# Patient Record
Sex: Male | Born: 1985 | Hispanic: Yes | Marital: Married | Smoking: Never smoker
Health system: Southern US, Community
[De-identification: ages and names within clinical notes are randomized; demographics above are authoritative.]

## PROBLEM LIST (undated history)

## (undated) DIAGNOSIS — E079 Disorder of thyroid, unspecified: Secondary | ICD-10-CM

## (undated) DIAGNOSIS — I471 Supraventricular tachycardia, unspecified: Secondary | ICD-10-CM

## (undated) DIAGNOSIS — I4891 Unspecified atrial fibrillation: Secondary | ICD-10-CM

## (undated) HISTORY — PX: OTHER SURGICAL HISTORY: SHX169

## (undated) HISTORY — PX: THYROIDECTOMY: SHX17

---

## 2013-01-22 ENCOUNTER — Other Ambulatory Visit: Payer: Self-pay

## 2013-01-22 ENCOUNTER — Emergency Department (HOSPITAL_COMMUNITY)
Admission: EM | Admit: 2013-01-22 | Discharge: 2013-01-23 | Disposition: A | Discharge status: Home / Self Care | Payer: Self-pay | Attending: Emergency Medicine | Admitting: Emergency Medicine

## 2013-01-22 ENCOUNTER — Emergency Department (HOSPITAL_COMMUNITY): Payer: Self-pay

## 2013-01-22 DIAGNOSIS — R0602 Shortness of breath: Secondary | ICD-10-CM | POA: Insufficient documentation

## 2013-01-22 DIAGNOSIS — R079 Chest pain, unspecified: Secondary | ICD-10-CM | POA: Insufficient documentation

## 2013-01-22 DIAGNOSIS — I4891 Unspecified atrial fibrillation: Secondary | ICD-10-CM | POA: Insufficient documentation

## 2013-01-22 LAB — CBC WITH DIFFERENTIAL/PLATELET
Basophils Absolute: 0 K/uL (ref 0.0–0.1)
Basophils Relative: 0 % (ref 0–1)
Eosinophils Absolute: 0.2 K/uL (ref 0.0–0.7)
Eosinophils Relative: 2 % (ref 0–5)
HCT: 43.6 % (ref 39.0–52.0)
Hemoglobin: 15.8 g/dL (ref 13.0–17.0)
Lymphocytes Relative: 41 % (ref 12–46)
Lymphs Abs: 3.9 K/uL (ref 0.7–4.0)
MCH: 30.3 pg (ref 26.0–34.0)
MCHC: 36.2 g/dL — ABNORMAL HIGH (ref 30.0–36.0)
MCV: 83.5 fL (ref 78.0–100.0)
Monocytes Absolute: 0.5 K/uL (ref 0.1–1.0)
Monocytes Relative: 5 % (ref 3–12)
Neutro Abs: 4.9 K/uL (ref 1.7–7.7)
Neutrophils Relative %: 52 % (ref 43–77)
Platelets: UNDETERMINED K/uL (ref 150–400)
RBC: 5.22 MIL/uL (ref 4.22–5.81)
RDW: 12.6 % (ref 11.5–15.5)
Smear Review: UNDETERMINED
WBC: 9.5 K/uL (ref 4.0–10.5)

## 2013-01-22 LAB — POCT I-STAT TROPONIN I: Troponin i, poc: 0.11 ng/mL (ref 0.00–0.08)

## 2013-01-22 LAB — POCT I-STAT, CHEM 8
BUN: 17 mg/dL (ref 6–23)
Calcium, Ion: 1.15 mmol/L (ref 1.12–1.23)
Chloride: 103 mEq/L (ref 96–112)
Glucose, Bld: 100 mg/dL — ABNORMAL HIGH (ref 70–99)

## 2013-01-22 MED ORDER — DILTIAZEM HCL ER COATED BEADS 120 MG PO CP24: 120.0000 mg | ORAL_CAPSULE | Freq: Every day | ORAL | Status: DC

## 2013-01-22 MED ORDER — METOPROLOL TARTRATE 1 MG/ML IV SOLN
5.0000 mg | Freq: Once | INTRAVENOUS | Status: AC
  Administered 2013-01-22: 5 mg via INTRAVENOUS
  Filled 2013-01-22: qty 5

## 2013-01-22 MED ORDER — ADENOSINE 6 MG/2ML IV SOLN
INTRAVENOUS | Status: AC
  Filled 2013-01-22: qty 4

## 2013-01-22 MED ORDER — DILTIAZEM HCL 100 MG IV SOLR: 5.0000 mg/h | INTRAVENOUS | Status: DC

## 2013-01-22 NOTE — ED Provider Notes (Signed)
History     CSN: [id number]  Arrival date & time 01/22/13  2018   None     No chief complaint on file.   (Consider location/radiation/quality/duration/timing/severity/associated sxs/prior treatment) Patient is a 27 y.o. male presenting with palpitations. The history is provided by the patient.  Palpitations Palpitations quality:  Fast Onset quality:  Sudden Duration:  2 hours Timing:  Constant Progression:  Unchanged Chronicity:  Recurrent Relieved by:  Nothing Worsened by:  Nothing tried Ineffective treatments:  None tried Associated symptoms: chest pain and shortness of breath   Associated symptoms: no diaphoresis, no dizziness, no nausea and no vomiting     No past medical history on file.  No past surgical history on file.  No family history on file.  History  Substance Use Topics  . Smoking status: Not on file  . Smokeless tobacco: Not on file  . Alcohol Use: Not on file      Review of Systems  Constitutional: Negative for fever, chills and diaphoresis.  HENT: Negative for congestion, sore throat, rhinorrhea and neck pain.   Respiratory: Positive for shortness of breath. Negative for chest tightness.   Cardiovascular: Positive for chest pain and palpitations. Negative for leg swelling.  Gastrointestinal: Negative for nausea, vomiting, abdominal pain, diarrhea and constipation.  Genitourinary: Negative for dysuria.  Neurological: Negative for dizziness, weakness and headaches.  All other systems reviewed and are negative.    Allergies  Review of patient's allergies indicates not on file.  Home Medications  No current outpatient prescriptions on file.  There were no vitals taken for this visit.  Physical Exam  Nursing note and vitals reviewed. Constitutional: He is oriented to person, place, and time. He appears well-developed and well-nourished. No distress.  HENT:  Head: Normocephalic and atraumatic.  Right Ear: External ear normal.  Left Ear:  External ear normal.  Mouth/Throat: Oropharynx is clear and moist.  Eyes: Pupils are equal, round, and reactive to light.  Neck: Normal range of motion. Neck supple.  Cardiovascular: Regular rhythm, normal heart sounds and intact distal pulses.  Exam reveals no gallop and no friction rub.   No murmur heard. Tachycardic to over 200   Pulmonary/Chest: Breath sounds normal. No respiratory distress. He has no wheezes. He has no rales.  Tachypnea   Abdominal: Soft. There is no tenderness. There is no rebound and no guarding.  Musculoskeletal: Normal range of motion. He exhibits no edema and no tenderness.  Lymphadenopathy:    He has no cervical adenopathy.  Neurological: He is alert and oriented to person, place, and time.  Skin: Skin is warm and dry. No rash noted. No erythema.  Psychiatric: He has a normal mood and affect. His behavior is normal.    ED Course  Procedures (including critical care time)  Labs Reviewed  CBC WITH DIFFERENTIAL - Abnormal; Notable for the following:    MCHC 36.2 (*)    All other components within normal limits  POCT I-STAT, CHEM 8 - Abnormal; Notable for the following:    Potassium 3.3 (*)    Glucose, Bld 100 (*)    All other components within normal limits  POCT I-STAT TROPONIN I - Abnormal; Notable for the following:    Troponin i, poc 0.11 (*)    All other components within normal limits  URINE RAPID DRUG SCREEN (HOSP PERFORMED)   Dg Chest Portable 1 View  01/22/2013   *RADIOLOGY REPORT*  Clinical Data: Chest pain and tachycardia.  PORTABLE CHEST - 1 VIEW  Comparison: None  Findings: Defibrillator pad overlying the left chest is noted. The cardiomediastinal silhouette is unremarkable. Pulmonary vascular congestion and mild interstitial edema noted. Bibasilar atelectasis is present. There is no evidence of pneumothorax.  IMPRESSION: Pulmonary vascular congestion with mild interstitial edema and bibasilar atelectasis.   Original Report Authenticated By:  Harmon Pier, M.D.     1. Atrial fibrillation      Date: 01/22/2013  Rate: 208  Rhythm: narrow complex, regular  QRS Axis: normal  Intervals: difficult to discern intervals due to heart rate eleavtion  ST/T Wave abnormalities: normal  Conduction Disutrbances:none  Narrative Interpretation:   Old EKG Reviewed: none available    MDM  61:75 PM 27 year old male with no significant past medical history presenting with palpitations and shortness of breath started earlier this evening. On arrival patient noted a heart rate around 220. EKG shows a narrow complex, regular tachycardia in the 200s. Vagal maneuvers failed. Blood pressure stable. The patient given 6 mg of adenosine with no response, then 12 mg with improvement of heart rate down below 100. Patient now noted to be in atrial fibrillation.  unsure of cause at this point. Will check labs, troponin and consult cardiology. He denies drug use or stimulant use. Reports h/o similar episodes in past but not lasting as long. Has never sought medical attention for this. Does reports drinking about 12 beers a day on the weekends, so consider holiday heart syndrome.   9:30 PM discussed with cardiology. recs to given metoprolol 5mg  IV and reassess.   11:18 PM rate improved to 1-teens to low 100s, but still mildly tachy. HR now around 106. He is now asymptomatic. 100% sats on RA. Tn mildly elevated likely due to demand, mild pulm edema also due to HR. Cardiology consulted who evaluated pt in ED. They do not feel he requires admission as he is now asymptomatic and HR <110. He does not meet criteria for anticoagulation. They recommend to give dose of cardizem 120mg  here and dc with prescription. Given number for cardiology and he will f/u. He voiced understanding of plan, given return precautions and dc'd home in stable condition. Pt counseled to stay away from stimulants.       Caren Hazy, MD 01/22/13 [phone]

## 2013-01-22 NOTE — ED Notes (Signed)
Pt placed on Zoll, crash cart outside pt room. Per MD Radford Pax give 6mg  of Adenosine, then 12mg . RN Janett Billow and RN Fayrene Fearing at bedside with patient.

## 2013-01-22 NOTE — ED Notes (Signed)
Spanish speaking pt presents with CP and tachycardia, placed on EKG HR 220s, pt pale, SOB. Taken back to room, IV established and Adenocard administered. 6mg , 12mg .

## 2013-01-22 NOTE — Progress Notes (Signed)
On-call cardiology:  Called to see 27 y/o male with no significant PMH who presented with palpitations.  He was reportedly in SVT on presentation that converted to AFIB after receiving Adenosine 6 mg and 12 mg. Currently, he is in AFIB with heart rate 93 bpm and is asymptomatic.  His CHADS-VASc score is = 0; thus he does not meet criteria for anti-coagulation.  He will be discharged on Cardizem CD 120 mg q daily, and he will follow-up with either his PCP on Cardiology within 2 weeks.  Gemma Payor, M.D.
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
## 2013-01-23 NOTE — ED Provider Notes (Signed)
I saw and evaluated the patient, reviewed the resident's note and I agree with the findings and plan.   .Face to face Exam:  General:  Awake HEENT:  Atraumatic Resp:  Normal effort Abd:  Nondistended Neuro:No focal weakness   CRITICAL CARE Performed by: Nelva Nay L Total critical care time: 30 min Critical care time was exclusive of separately billable procedures and treating other patients. Critical care was necessary to treat or prevent imminent or life-threatening deterioration. Critical care was time spent personally by me on the following activities: development of treatment plan with patient and/or surrogate as well as nursing, discussions with consultants, evaluation of patient's response to treatment, examination of patient, obtaining history from patient or surrogate, ordering and performing treatments and interventions, ordering and review of laboratory studies, ordering and review of radiographic studies, pulse oximetry and re-evaluation of patient's condition.   Nelia Shi, MD 01/23/13 [phone]

## 2013-03-25 ENCOUNTER — Institutional Professional Consult (permissible substitution): Payer: Self-pay | Admitting: Cardiology

## 2013-04-05 ENCOUNTER — Encounter: Payer: Self-pay | Admitting: Cardiology

## 2013-11-07 ENCOUNTER — Emergency Department (HOSPITAL_COMMUNITY): Payer: Medicaid Other

## 2013-11-07 ENCOUNTER — Inpatient Hospital Stay (HOSPITAL_COMMUNITY)
Admission: EM | Admit: 2013-11-07 | Discharge: 2013-11-12 | DRG: 644 | Disposition: A | Discharge status: Home / Self Care | Payer: Medicaid Other | Attending: Internal Medicine | Admitting: Internal Medicine

## 2013-11-07 ENCOUNTER — Encounter (HOSPITAL_COMMUNITY): Payer: Self-pay | Admitting: Emergency Medicine

## 2013-11-07 DIAGNOSIS — I471 Supraventricular tachycardia, unspecified: Secondary | ICD-10-CM | POA: Diagnosis present

## 2013-11-07 DIAGNOSIS — Z79899 Other long term (current) drug therapy: Secondary | ICD-10-CM

## 2013-11-07 DIAGNOSIS — I4891 Unspecified atrial fibrillation: Secondary | ICD-10-CM

## 2013-11-07 DIAGNOSIS — R778 Other specified abnormalities of plasma proteins: Secondary | ICD-10-CM | POA: Diagnosis present

## 2013-11-07 DIAGNOSIS — E876 Hypokalemia: Secondary | ICD-10-CM | POA: Diagnosis present

## 2013-11-07 DIAGNOSIS — R7989 Other specified abnormal findings of blood chemistry: Secondary | ICD-10-CM

## 2013-11-07 DIAGNOSIS — I498 Other specified cardiac arrhythmias: Secondary | ICD-10-CM | POA: Diagnosis present

## 2013-11-07 DIAGNOSIS — G839 Paralytic syndrome, unspecified: Secondary | ICD-10-CM | POA: Diagnosis present

## 2013-11-07 DIAGNOSIS — E0590 Thyrotoxicosis, unspecified without thyrotoxic crisis or storm: Principal | ICD-10-CM | POA: Diagnosis present

## 2013-11-07 HISTORY — DX: Unspecified atrial fibrillation: I48.91

## 2013-11-07 HISTORY — DX: Supraventricular tachycardia, unspecified: I47.10

## 2013-11-07 HISTORY — DX: Supraventricular tachycardia: I47.1

## 2013-11-07 LAB — CBC WITH DIFFERENTIAL/PLATELET
BASOS PCT: 0 % (ref 0–1)
Basophils Absolute: 0 10*3/uL (ref 0.0–0.1)
EOS ABS: 0 10*3/uL (ref 0.0–0.7)
EOS PCT: 0 % (ref 0–5)
HCT: 43.6 % (ref 39.0–52.0)
Hemoglobin: 15.2 g/dL (ref 13.0–17.0)
LYMPHS ABS: 0.7 10*3/uL (ref 0.7–4.0)
Lymphocytes Relative: 8 % — ABNORMAL LOW (ref 12–46)
MCH: 27.5 pg (ref 26.0–34.0)
MCHC: 34.9 g/dL (ref 30.0–36.0)
MCV: 78.8 fL (ref 78.0–100.0)
MONOS PCT: 1 % — AB (ref 3–12)
Monocytes Absolute: 0.1 10*3/uL (ref 0.1–1.0)
Neutro Abs: 8.3 10*3/uL — ABNORMAL HIGH (ref 1.7–7.7)
Neutrophils Relative %: 91 % — ABNORMAL HIGH (ref 43–77)
PLATELETS: 375 10*3/uL (ref 150–400)
RBC: 5.53 MIL/uL (ref 4.22–5.81)
RDW: 12.5 % (ref 11.5–15.5)
WBC: 9.1 10*3/uL (ref 4.0–10.5)

## 2013-11-07 LAB — CBG MONITORING, ED: Glucose-Capillary: 160 mg/dL — ABNORMAL HIGH (ref 70–99)

## 2013-11-07 LAB — TROPONIN I: Troponin I: 0.32 ng/mL (ref <0.30)

## 2013-11-07 LAB — PROTIME-INR
INR: 1.07 (ref 0.00–1.49)
Prothrombin Time: 13.7 seconds (ref 11.6–15.2)

## 2013-11-07 LAB — RAPID URINE DRUG SCREEN, HOSP PERFORMED
Amphetamines: NOT DETECTED
BENZODIAZEPINES: NOT DETECTED
Barbiturates: NOT DETECTED
COCAINE: NOT DETECTED
OPIATES: NOT DETECTED
Tetrahydrocannabinol: NOT DETECTED

## 2013-11-07 LAB — BASIC METABOLIC PANEL
BUN: 17 mg/dL (ref 6–23)
CALCIUM: 10.1 mg/dL (ref 8.4–10.5)
CO2: 17 mEq/L — ABNORMAL LOW (ref 19–32)
Chloride: 99 mEq/L (ref 96–112)
Creatinine, Ser: 0.57 mg/dL (ref 0.50–1.35)
GLUCOSE: 154 mg/dL — AB (ref 70–99)
Potassium: <2.2 mEq/L — CL (ref 3.7–5.3)
SODIUM: 138 meq/L (ref 137–147)

## 2013-11-07 LAB — APTT: aPTT: 26 seconds (ref 24–37)

## 2013-11-07 LAB — MAGNESIUM: MAGNESIUM: 1.8 mg/dL (ref 1.5–2.5)

## 2013-11-07 LAB — ETHANOL

## 2013-11-07 MED ORDER — ADENOSINE 6 MG/2ML IV SOLN
12.0000 mg | Freq: Once | INTRAVENOUS | Status: AC
  Administered 2013-11-07: 12 mg via INTRAVENOUS

## 2013-11-07 MED ORDER — ADENOSINE 6 MG/2ML IV SOLN
INTRAVENOUS | Status: AC
  Administered 2013-11-07: 6 mg
  Filled 2013-11-07: qty 6

## 2013-11-07 MED ORDER — DILTIAZEM HCL 100 MG IV SOLR
5.0000 mg/h | INTRAVENOUS | Status: DC
  Administered 2013-11-07: 10 mg/h via INTRAVENOUS
  Administered 2013-11-08: 15 mg/h via INTRAVENOUS
  Filled 2013-11-07: qty 100

## 2013-11-07 MED ORDER — SODIUM CHLORIDE 0.9 % IV BOLUS (SEPSIS)
1000.0000 mL | Freq: Once | INTRAVENOUS | Status: AC
  Administered 2013-11-07: 1000 mL via INTRAVENOUS

## 2013-11-07 MED ORDER — POTASSIUM CHLORIDE 10 MEQ/100ML IV SOLN
10.0000 meq | INTRAVENOUS | Status: AC
  Administered 2013-11-08 (×4): 10 meq via INTRAVENOUS
  Filled 2013-11-07 (×3): qty 100

## 2013-11-07 MED ORDER — POTASSIUM CHLORIDE CRYS ER 20 MEQ PO TBCR
40.0000 meq | EXTENDED_RELEASE_TABLET | Freq: Once | ORAL | Status: AC
  Administered 2013-11-08: 40 meq via ORAL
  Filled 2013-11-07: qty 2

## 2013-11-07 MED ORDER — METOPROLOL TARTRATE 1 MG/ML IV SOLN
5.0000 mg | Freq: Once | INTRAVENOUS | Status: AC
  Administered 2013-11-07: 5 mg via INTRAVENOUS
  Filled 2013-11-07: qty 5

## 2013-11-07 MED ORDER — AMIODARONE HCL 150 MG/3ML IV SOLN: 60.0000 mg/h | Freq: Once | INTRAVENOUS | Status: DC
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
## 2013-11-07 NOTE — ED Notes (Signed)
Pt c/o chest pain and states it feels like his heart is beating too fast

## 2013-11-07 NOTE — ED Provider Notes (Addendum)
CSN: [id number]     Arrival date & time 11/07/13  1956 History   First MD Initiated Contact with Patient 11/07/13 2213     Chief Complaint  Patient presents with  . Weakness     (Consider location/radiation/quality/duration/timing/severity/associated sxs/prior Treatment) Patient is a 28 y.o. male presenting with weakness.  Weakness   Level 5 caveat due to language barrier and urgent need for intervention Called to patient room for HR approx 200 and patient vomiting. Narrow complex tachycardia on monitor, pt and family unable to give history initially. IV initiated and given adenosine with long asystolic pauses and a brief episode of unresponsiveness. He had 3-5 chest compressions before return of HR and consciousness, this was approx 10 seconds total. He converted to atrial fibrillation  History reviewed. No pertinent past medical history. History reviewed. No pertinent past surgical history. No family history on file. History  Substance Use Topics  . Smoking status: Never Smoker   . Smokeless tobacco: Not on file  . Alcohol Use: Yes     Comment: occ    Review of Systems  Neurological: Positive for weakness.   All other systems reviewed and are negative except as noted in HPI.     Allergies  Review of patient's allergies indicates no known allergies.  Home Medications   Current Outpatient Rx  Name  Route  Sig  Dispense  Refill  . acetaminophen (TYLENOL) 500 MG tablet   Oral   Take 1,000 mg by mouth every 6 (six) hours as needed for headache.         . indomethacin (INDOCIN) 25 MG capsule   Oral   Take 25 mg by mouth 3 (three) times daily with meals.          BP 148/65  Pulse 104  Temp(Src) 98 F (36.7 C) (Oral)  Resp 40  SpO2 99% Physical Exam  Nursing note and vitals reviewed. Constitutional: He is oriented to person, place, and time. He appears well-developed and well-nourished.  HENT:  Head: Normocephalic and atraumatic.  Eyes: EOM are normal.  Pupils are equal, round, and reactive to light.  Neck: Normal range of motion. Neck supple.  Cardiovascular: Normal heart sounds and intact distal pulses.  An irregular rhythm present. Tachycardia present.   Pulmonary/Chest: Effort normal and breath sounds normal.  Abdominal: Bowel sounds are normal. He exhibits no distension. There is no tenderness.  Musculoskeletal: Normal range of motion. He exhibits no edema and no tenderness.  Neurological: He is alert and oriented to person, place, and time. No cranial nerve deficit or sensory deficit.  Decreased strength throughout  Skin: Skin is warm and dry. No rash noted.  Psychiatric: He has a normal mood and affect.    ED Course  Procedures (including critical care time)  CRITICAL CARE Performed by: Pollyann Savoy. Total critical care time: 75 Critical care time was exclusive of separately billable procedures and treating other patients. Critical care was necessary to treat or prevent imminent or life-threatening deterioration. Critical care was time spent personally by me on the following activities: development of treatment plan with patient and/or surrogate as well as nursing, discussions with consultants, evaluation of patient's response to treatment, examination of patient, obtaining history from patient or surrogate, ordering and performing treatments and interventions, ordering and review of laboratory studies, ordering and review of radiographic studies, pulse oximetry and re-evaluation of patient's condition.  Labs Review Labs Reviewed  CBC WITH DIFFERENTIAL - Abnormal; Notable for the following:    Neutrophils Relative %  91 (*)    Neutro Abs 8.3 (*)    Lymphocytes Relative 8 (*)    Monocytes Relative 1 (*)    All other components within normal limits  BASIC METABOLIC PANEL - Abnormal; Notable for the following:    Potassium <2.2 (*)    CO2 17 (*)    Glucose, Bld 154 (*)    All other components within normal limits  TROPONIN  I - Abnormal; Notable for the following:    Troponin I 0.32 (*)    All other components within normal limits  CBG MONITORING, ED - Abnormal; Notable for the following:    Glucose-Capillary 160 (*)    All other components within normal limits  PROTIME-INR  APTT  MAGNESIUM  ETHANOL  URINE RAPID DRUG SCREEN (HOSP PERFORMED)   Imaging Review Dg Chest Port 1 View  11/07/2013   CLINICAL DATA:  Chest pain, throat pain and shortness of breath with tachycardia.  EXAM: PORTABLE CHEST - 1 VIEW  COMPARISON:  DG CHEST 1V PORT dated 01/22/2013  FINDINGS: Cardiomediastinal silhouette is unremarkable for this low inspiratory portable examination with crowded similarly engorged vasculature markings. The lungs are otherwise clear without pleural effusions or focal consolidations. Decreased interstitial edema. Trachea projects midline and there is no pneumothorax. Stable right mid lung zone subcentimeter granuloma. Included soft tissue planes and osseous structures are non-suspicious. Multiple EKG lines overlie the patient and may obscure subtle underlying pathology. Gaseous distended stomach.  IMPRESSION: Similar pulmonary vasculature congestion/ decreased pulmonary edema.   Electronically Signed   By: Awilda Metroourtnay  Bloomer   On: 11/07/2013 23:02    MUSE will not pull up EKGs.    Date: 11/07/2013  Rate: 87  Rhythm: normal sinus rhythm  QRS Axis: normal  Intervals: normal  ST/T Wave abnormalities: nonspecific ST/T changes  Conduction Disutrbances:none  Narrative Interpretation:   Old EKG Reviewed: nonspecific ST changes from 6/14  Pt with narrow complex tachycardia converted to afib. Called back to room for return of marked tachycardia. Pt given additional adenosine and converted to afib rate around 100. Watching on the monitor for several minutes, patient moves from tachycardia, to afib to sinus frequently, lasting 10-20 seconds at a time.     MDM   Final diagnoses:  Atrial fibrillation  SVT  (supraventricular tachycardia)  Hypokalemia    Telephone interpreter used for more details. Pt states he has just felt weak all day, legs were weak and would not work well.  Has had 'chest pain' off and on for 6-8 months since an ED admission for similar presentation that was self-limited, never followed up with Cards. Discussed with Dr. Shirlee LatchMclean on call for Cards who recommends lopressor 5mg  IV and initiation of Cardizem drip which has controlled his rate. Discussed with Hospitalist, Dr. Julian ReilGardner, who will evaluate the patient for admission [facility].     Meila Berke B. Bernette MayersSheldon, MD 11/08/13 0007  Bonnita Levanharles B. Bernette MayersSheldon, MD 11/08/13 1540

## 2013-11-08 ENCOUNTER — Encounter (HOSPITAL_COMMUNITY): Payer: Self-pay | Admitting: *Deleted

## 2013-11-08 DIAGNOSIS — I4891 Unspecified atrial fibrillation: Secondary | ICD-10-CM | POA: Diagnosis present

## 2013-11-08 DIAGNOSIS — R778 Other specified abnormalities of plasma proteins: Secondary | ICD-10-CM | POA: Diagnosis present

## 2013-11-08 DIAGNOSIS — E876 Hypokalemia: Secondary | ICD-10-CM | POA: Diagnosis present

## 2013-11-08 DIAGNOSIS — R7989 Other specified abnormal findings of blood chemistry: Secondary | ICD-10-CM

## 2013-11-08 DIAGNOSIS — I471 Supraventricular tachycardia: Secondary | ICD-10-CM

## 2013-11-08 DIAGNOSIS — R799 Abnormal finding of blood chemistry, unspecified: Secondary | ICD-10-CM

## 2013-11-08 DIAGNOSIS — I498 Other specified cardiac arrhythmias: Secondary | ICD-10-CM

## 2013-11-08 LAB — TROPONIN I
Troponin I: 0.82 ng/mL (ref <0.30)
Troponin I: 0.72 ng/mL (ref <0.30)

## 2013-11-08 LAB — POTASSIUM: Potassium: 5 mEq/L (ref 3.7–5.3)

## 2013-11-08 LAB — MRSA PCR SCREENING: MRSA by PCR: NEGATIVE

## 2013-11-08 MED ORDER — HEPARIN SODIUM (PORCINE) 5000 UNIT/ML IJ SOLN
5000.0000 [IU] | Freq: Three times a day (TID) | INTRAMUSCULAR | Status: DC
  Administered 2013-11-08 – 2013-11-12 (×14): 5000 [IU] via SUBCUTANEOUS
  Filled 2013-11-08 (×17): qty 1

## 2013-11-08 MED ORDER — ONDANSETRON HCL 4 MG/2ML IJ SOLN
4.0000 mg | Freq: Once | INTRAMUSCULAR | Status: AC
  Administered 2013-11-08: 4 mg via INTRAVENOUS
  Filled 2013-11-08: qty 2

## 2013-11-08 MED ORDER — INFLUENZA VAC SPLIT QUAD 0.5 ML IM SUSP
0.5000 mL | INTRAMUSCULAR | Status: DC
  Filled 2013-11-08: qty 0.5

## 2013-11-08 MED ORDER — MORPHINE SULFATE 2 MG/ML IJ SOLN
1.0000 mg | Freq: Once | INTRAMUSCULAR | Status: AC
  Administered 2013-11-08: 1 mg via INTRAVENOUS
  Filled 2013-11-08: qty 1

## 2013-11-08 MED ORDER — SODIUM CHLORIDE 0.9 % IJ SOLN
3.0000 mL | Freq: Two times a day (BID) | INTRAMUSCULAR | Status: DC
  Administered 2013-11-08 – 2013-11-12 (×9): 3 mL via INTRAVENOUS

## 2013-11-08 MED FILL — Medication: Qty: 1 | Status: AC

## 2013-11-08 NOTE — Care Management Note (Addendum)
    Page 1 of 1   11/10/2013     3:52:17 PM   CARE MANAGEMENT NOTE 11/10/2013  Patient:  Jacinta ShoeGIL-AGUILAR,Jarmarcus   Account Number:  [id number]  Date Initiated:  11/08/2013  Documentation initiated by:  Junius CreamerWELL,DEBBIE  Subjective/Objective Assessment:   adm w hypokalemia     Action/Plan:   lives w wife   Anticipated DC Date:     Anticipated DC Plan:        DC Planning Services  CM consult  [facility]      Choice offered to / List presented to:             Status of service:   Medicare Important Message given?   (If response is "NO", the following Medicare IM given date fields will be blank) Date Medicare IM given:   Date Additional Medicare IM given:    Discharge Disposition:    Per UR Regulation:  Reviewed for med. necessity/level of care/duration of stay  If discussed at Long Length of Stay Meetings, dates discussed:    Comments:  3/26  1550 debbie Shantelle Alles rn,bsn filled out match letter and left on shadow chart. will need interpreter at disch to go over [facility]'s and match letter.  3/24 1432 debbie Doyle Tegethoff rn,bsn pt sleeping. left [facility] resource list in room w 2prescription discount cards. will get interpreter to go over list w dc instruction when pt disch. not sure when pt will be disch.
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
## 2013-11-08 NOTE — Consult Note (Signed)
CARDIOLOGY CONSULT NOTE  Patient ID: Andrew Duffy MRN: [id number] DOB/AGE: [birth year] 28 y.o.  Admit date: 11/07/2013 Primary Physician None Primary Cardiologist None  Chief Complaint  SVT  HPI:  The patient has no prior cardiac history.  He presented to the ED with generalized weakness.  He was found to have profound hypokalemia (2.2).  He developed SVT in the ED and was treated with adenosine.  He had a brief episode of unresponsiveness with asystole and had 5 - 6 chest compressions.  He then went into atrial fib and was treated with IV Cardizem and metoprolol. He was supplemented with a total of 60 meq of potassium in the ED.   We are consulted for this and an elevated troponin.   Of note he had a similar presentation to this in June of last year.  He has SVT and converted to atrial fib with adenosine.  He was discharged from the ED on that occasion with Cardizem CD and instructed to follow with cardiology.  However, he did not have this follow up.  Of note during that ED visit he had some pulmonary edema with his rapid heart rate.   He said that he presented to the ED yesterday because his legs felt week.  He thought this might have been because he had been riding a bike the day before.  It is difficult to get a clear history because of a language barrier although I used the interpreter phone.  This was a report of a fall from a bike but this did not happen.  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  He had no fall, no syncope.  He did not feel his heart beating fast until he was in the ED.  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  Riding the bike he had no symptoms.  He has had a few short lived palpitations since he presented last June but nothing sustained.   Past Medical History  Diagnosis Date  . SVT (supraventricular tachycardia)   . Atrial fibrillation     Following treatment of SVT with adenosine     Past Surgical History  Procedure Laterality Date  . None      No Known Allergies Prescriptions prior to admission  Medication Sig Dispense Refill  . acetaminophen (TYLENOL) 500 MG tablet Take 1,000 mg by mouth every 6 (six) hours as needed for headache.      . indomethacin (INDOCIN) 25 MG capsule Take 25 mg by mouth 3 (three) times daily with meals.       History reviewed. No pertinent family history.  History   Social History  . Marital Status: Married    Spouse Name: N/A    Number of Children: N/A  . Years of Education: N/A   Occupational History  . Not on file.   Social History Main Topics  . Smoking status: Current Some Day Smoker    Types: Cigarettes  . Smokeless tobacco: Not on file     Comment: Few cigarettes a week.    . Alcohol Use: No     Comment: occ  . Drug Use: No  . Sexual Activity: Not on file   Other Topics Concern  . Not on file   Social History Narrative  . No narrative on file     ROS:    As stated in the HPI and negative for all  other systems.  Physical Exam: Blood pressure 111/53, pulse 98, temperature 98.5 F (36.9 C), temperature source Oral, resp. rate 29, height 5\' 9"  (1.753 m), weight 149 lb 7.6 oz (67.8 kg), SpO2 98.00%.  GENERAL:  Well appearing HEENT:  Pupils equal round and reactive, fundi not visualized, oral mucosa unremarkable NECK:  No jugular venous distention, waveform within normal limits, carotid upstroke brisk and symmetric, no bruits, no thyromegaly LYMPHATICS:  No cervical, inguinal adenopathy LUNGS:  Clear to auscultation bilaterally BACK:  No CVA tenderness CHEST:  Unremarkable HEART:  PMI not displaced or sustained,S1 and S2 within normal limits, no S3, no S4, no clicks, no rubs, no murmurs ABD:  Flat, positive bowel sounds normal in frequency in pitch, no bruits, no rebound, no guarding, no midline pulsatile mass, no hepatomegaly, no splenomegaly EXT:  2 plus pulses throughout, no edema, no cyanosis no  clubbing SKIN:  No rashes no nodules NEURO:  Cranial nerves II through XII grossly intact, motor grossly intact throughout PSYCH:  Cognitively intact, oriented to person place and time   Labs: Lab Results  Component Value Date   BUN 17 11/07/2013   Lab Results  Component Value Date   CREATININE 0.57 11/07/2013   Lab Results  Component Value Date   NA 138 11/07/2013   K 5.0 11/08/2013   CL 99 11/07/2013   CO2 17* 11/07/2013   Lab Results  Component Value Date   TROPONINI 0.82* 11/08/2013   Lab Results  Component Value Date   WBC 9.1 11/07/2013   HGB 15.2 11/07/2013   HCT 43.6 11/07/2013   MCV 78.8 11/07/2013   PLT 375 11/07/2013     Radiology:   CXR: Cardiomediastinal silhouette is unremarkable for this low  inspiratory portable examination with crowded similarly engorged  vasculature markings. The lungs are otherwise clear without pleural  effusions or focal consolidations. Decreased interstitial edema.  Trachea projects midline and there is no pneumothorax. Stable right  mid lung zone subcentimeter granuloma. Included soft tissue planes  and osseous structures are non-suspicious. Multiple EKG lines  overlie the patient and may obscure subtle underlying pathology.  Gaseous distended stomach.  EKG: SVT with diffuse ST depression.  Follow up EKG 11/07/13 20:47 NSR, rate 87, marked QTc prolongation,  Diffuse T wave flattening.  Resolution of ST depression.   ASSESSMENT AND PLAN:   TACHYCARDIA:  SVT. At this point the patient would prefer to try to take Cardizem.  However, he would agree to ablation if he has recurrent symptoms.  He does agree to follow up with us this time.  We will schedule follow up with EP after discharge.    ELEVATED TROPONIN:  This is likely secondary to the SVT.  I will check an echocardiogram.  I will likely bring the patient back as an outpatient for a  POET (Plain Old Exercise Test). This will allow me to screen for obstructive coronary with a low pretest  probability.  Continue to cycle enzymes as they are still rising.  If he has any continued abnormality on the EKG, continued enzyme rise or abnormal echo he should have in patient testing.   HYPOKALEMIA:  It is hard to understand why the potassium would have been 2.2.  He was not having losses as far as I can tell.  Equally difficult to understand is why it would have risen to 5.0 with only 60 meq of potassium.  However, his EKG was markedly abnormal with prolonged QT.  There is a depression of the  ST segment and decreased T wave amplitude that can be seen with hypokalemia.  I will order a repeat EKG.   SignedRollene Rotunda 11/08/2013, 12:22 PM

## 2013-11-08 NOTE — Progress Notes (Signed)
Patient seen and evaluated earlier this a.m.  We'll reassess next a.m.  Consulted cardiology  HowardwickVEGA, HatleyORLANDO

## 2013-11-09 ENCOUNTER — Inpatient Hospital Stay (HOSPITAL_COMMUNITY): Payer: Medicaid Other

## 2013-11-09 DIAGNOSIS — I4891 Unspecified atrial fibrillation: Secondary | ICD-10-CM

## 2013-11-09 DIAGNOSIS — E876 Hypokalemia: Secondary | ICD-10-CM

## 2013-11-09 DIAGNOSIS — I471 Supraventricular tachycardia: Secondary | ICD-10-CM

## 2013-11-09 DIAGNOSIS — E0590 Thyrotoxicosis, unspecified without thyrotoxic crisis or storm: Secondary | ICD-10-CM | POA: Diagnosis present

## 2013-11-09 LAB — COMPREHENSIVE METABOLIC PANEL
ALBUMIN: 3.4 g/dL — AB (ref 3.5–5.2)
ALT: 42 U/L (ref 0–53)
AST: 30 U/L (ref 0–37)
Alkaline Phosphatase: 131 U/L — ABNORMAL HIGH (ref 39–117)
BILIRUBIN TOTAL: 0.6 mg/dL (ref 0.3–1.2)
BUN: 14 mg/dL (ref 6–23)
CHLORIDE: 100 meq/L (ref 96–112)
CO2: 25 mEq/L (ref 19–32)
Calcium: 9.9 mg/dL (ref 8.4–10.5)
Creatinine, Ser: 0.55 mg/dL (ref 0.50–1.35)
GFR calc Af Amer: >90 mL/min (ref >90)
Glucose, Bld: 157 mg/dL — ABNORMAL HIGH (ref 70–99)
Potassium: 4.1 mEq/L (ref 3.7–5.3)
Sodium: 140 mEq/L (ref 137–147)
Total Protein: 6.8 g/dL (ref 6.0–8.3)

## 2013-11-09 LAB — OSMOLALITY: Osmolality: 292 mOsm/kg (ref 275–300)

## 2013-11-09 LAB — NA AND K (SODIUM & POTASSIUM), RAND UR
Potassium Urine: 36 mEq/L
Sodium, Ur: 148 mEq/L

## 2013-11-09 LAB — CBC WITH DIFFERENTIAL/PLATELET
BASOS ABS: 0 10*3/uL (ref 0.0–0.1)
BASOS PCT: 0 % (ref 0–1)
Eosinophils Absolute: 0.1 10*3/uL (ref 0.0–0.7)
Eosinophils Relative: 1 % (ref 0–5)
HCT: 42.1 % (ref 39.0–52.0)
HEMOGLOBIN: 14.5 g/dL (ref 13.0–17.0)
Lymphocytes Relative: 57 % — ABNORMAL HIGH (ref 12–46)
Lymphs Abs: 3.8 10*3/uL (ref 0.7–4.0)
MCH: 27.9 pg (ref 26.0–34.0)
MCHC: 34.4 g/dL (ref 30.0–36.0)
MCV: 81 fL (ref 78.0–100.0)
Monocytes Absolute: 0.4 10*3/uL (ref 0.1–1.0)
Monocytes Relative: 6 % (ref 3–12)
NEUTROS ABS: 2.4 10*3/uL (ref 1.7–7.7)
NEUTROS PCT: 36 % — AB (ref 43–77)
Platelets: 279 10*3/uL (ref 150–400)
RBC: 5.2 MIL/uL (ref 4.22–5.81)
RDW: 13.4 % (ref 11.5–15.5)
WBC: 6.7 10*3/uL (ref 4.0–10.5)

## 2013-11-09 LAB — TSH

## 2013-11-09 LAB — CREATININE, URINE, RANDOM: CREATININE, URINE: 86.55 mg/dL

## 2013-11-09 LAB — TROPONIN I: TROPONIN I: 0.45 ng/mL — AB (ref <0.30)

## 2013-11-09 LAB — MAGNESIUM: MAGNESIUM: 2 mg/dL (ref 1.5–2.5)

## 2013-11-09 LAB — OSMOLALITY, URINE: OSMOLALITY UR: 656 mosm/kg (ref 390–1090)

## 2013-11-09 MED ORDER — METOPROLOL TARTRATE 25 MG PO TABS
25.0000 mg | ORAL_TABLET | Freq: Two times a day (BID) | ORAL | Status: DC
  Administered 2013-11-09 – 2013-11-12 (×5): 25 mg via ORAL
  Filled 2013-11-09 (×7): qty 1

## 2013-11-09 NOTE — Progress Notes (Signed)
  Echocardiogram 2D Echocardiogram has been performed.  Dorothey BasemanReel, Janye Maynor M 11/09/2013, 10:11 AM

## 2013-11-09 NOTE — Consult Note (Signed)
ELECTROPHYSIOLOGY CONSULT NOTE   Patient ID: Andrew Duffy MRN: [id number], DOB/AGE: February 20, 1986   Admit date: 11/07/2013 Date of Consult: 11/09/2013  Primary Physician: No PCP Per Patient Primary Cardiologist: New to [facility] Reason for Consultation: SVT  History of Present Illness Andrew Duffy is a 28 y.o. male with no prior cardiac history who presented 2 days ago with bilateral upper and lower extremity paralysis and tachycardia. History is obtained with the assistance of an interpreter in person. Andrew Duffy reports frequent racing palpitations that last 5-10 minutes at a time. They occur at least once monthly and he has had palpitations for 5 years, since moving to the [place] from [place]. He has never sought medical attention because they are usually brief. He has dizziness with his palpitations but denies other symptoms. He denies CP or SOB. He does not have extremity weakness / numbness with his palpitations typically. He reports his first episode of extremity weakness occurred one month ago but was only involving the legs and less severe than the episode prompting his admission. On the day of admission he developed severe upper and lower extremity weakness and numbness with eventual inability to move his arms or legs. He came to the ED and was found to have profound hypokalemia. On presentation he was in SR. An hour and half later he became tachycardic and ECG shows a regular, narrow complex tachycardia at 185 bpm. He was given IV adenosine with significant pause to atrial fibrillation.        Past Medical History Past Medical History  Diagnosis Date  . SVT (supraventricular tachycardia)   . Atrial fibrillation     Following treatment of SVT with adenosine    Past Surgical History Past Surgical History  Procedure Laterality Date  . None      Allergies/Intolerances No Known Allergies  Inpatient Medications . heparin  5,000 Units Subcutaneous 3 times per day    . influenza vac split quadrivalent PF  0.5 mL Intramuscular Tomorrow-1000  . sodium chloride  3 mL Intravenous Q12H     Family History Negative for CAD or SCD   Social History History   Social History  . Marital Status: Married    Spouse Name: N/A    Number of Children: N/A  . Years of Education: N/A   Occupational History  . Not on file.   Social History Main Topics  . Smoking status: Current Some Day Smoker    Types: Cigarettes  . Smokeless tobacco: Not on file     Comment: Few cigarettes a week.    . Alcohol Use: No     Comment: occ  . Drug Use: No  . Sexual Activity: Not on file   Other Topics Concern  . Not on file   Social History Narrative  . No narrative on file     Review of Systems General: No chills, fever, night sweats or weight changes  Cardiovascular:  No chest pain, dyspnea on exertion, edema, orthopnea, palpitations, paroxysmal nocturnal dyspnea Dermatological: No rash, lesions or masses Respiratory: No cough, dyspnea Urologic: No hematuria, dysuria Abdominal: No nausea, vomiting, diarrhea, bright red blood per rectum, melena, or hematemesis Neurologic: No visual changes, weakness, changes in mental status All other systems reviewed and are otherwise negative except as noted above.  Physical Exam Vitals: Blood pressure 108/59, pulse 73, temperature 98.9 F (37.2 C), temperature source Oral, resp. rate 24, height 5\' 9"  (1.753 m), weight 149 lb 7.6 oz (67.8 kg), SpO2 97.00%.  General: Well  developed, well appearing 28 y.o. male in no acute distress. HEENT: Normocephalic, atraumatic. EOMs intact. Sclera nonicteric. Oropharynx clear.  Neck: Supple. No JVD. Lungs: Respirations regular and unlabored, CTA bilaterally. No wheezes, rales or rhonchi. Heart: RRR. S1, S2 present. No murmurs, rub, S3 or S4. Abdomen: Soft, non-tender, non-distended. BS present x 4 quadrants. No hepatosplenomegaly.  Extremities: No clubbing, cyanosis or edema. DP/PT/Radials  2+ and equal bilaterally. Psych: Normal affect. Neuro: Alert and oriented X 3. Moves all extremities spontaneously. Musculoskeletal: No kyphosis. Skin: Intact. Warm and dry. No rashes or petechiae in exposed areas.   Labs  Recent Labs  11/07/13 2245 11/08/13 0030 11/08/13 2213 11/09/13 0850  TROPONINI 0.32* 0.82* 0.72* 0.45*   Lab Results  Component Value Date   WBC 6.7 11/09/2013   HGB 14.5 11/09/2013   HCT 42.1 11/09/2013   MCV 81.0 11/09/2013   PLT 279 11/09/2013    Recent Labs Lab 11/09/13 0850  NA 140  K 4.1  CL 100  CO2 25  BUN 14  CREATININE 0.55  CALCIUM 9.9  PROT 6.8  BILITOT 0.6  ALKPHOS 131*  ALT 42  AST 30  GLUCOSE 157*    Recent Labs  11/09/13 0850  TSH <0.008*    Recent Labs  11/07/13 2245  INR 1.07    Radiology/Studies Dg Chest Port 1 View  11/07/2013   CLINICAL DATA:  Chest pain, throat pain and shortness of breath with tachycardia.  EXAM: PORTABLE CHEST - 1 VIEW  COMPARISON:  DG CHEST 1V PORT dated 01/22/2013  FINDINGS: Cardiomediastinal silhouette is unremarkable for this low inspiratory portable examination with crowded similarly engorged vasculature markings. The lungs are otherwise clear without pleural effusions or focal consolidations. Decreased interstitial edema. Trachea projects midline and there is no pneumothorax. Stable right mid lung zone subcentimeter granuloma. Included soft tissue planes and osseous structures are non-suspicious. Multiple EKG lines overlie the patient and may obscure subtle underlying pathology. Gaseous distended stomach.  IMPRESSION: Similar pulmonary vasculature congestion/ decreased pulmonary edema.   Electronically Signed   By: Awilda Metroourtnay  Bloomer   On: 11/07/2013 23:02   Echocardiogram  Pending  12-lead ECG on presentation - SR at 87 bpm Telemetry reviewed - from ED at 20:47, in SR on presentation then developed SVT at 22:19 was given IV adenosine with profound bradycardia from 22:19:05 to 22:19:23 then into  atrial fibrillation; currently in SR   Assessment and Plan 1. SVT 2. 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 Hypokalemia At this time, we recommend adding metoprolol. Await echo. Correct / treat hyperthyroidism and will need Endocrinology evaluation. Dr. Graciela HusbandsKlein has requested that Dr. Evlyn KannerSouth see Andrew Duffy in consultation. We appreciate his help. We discussed treatment options for SVT including medical therapy and EPS +RF ablation. We will treat medically with metoprolol for now and postpone EPS +RF ablation until after his electrolyte and endocrine abnormalities are corrected.   Signed, Rick DuffDMISTEN, Glorianne Proctor, PA-C 11/09/2013, 4:19 PM

## 2013-11-09 NOTE — Progress Notes (Signed)
Patient Name: Andrew Duffy Date of Encounter: 11/09/2013  Active Problems:   Hypokalemia   A-fib   SVT (supraventricular tachycardia)   Elevated troponin   Length of Stay: 2  SUBJECTIVE  Feels back to normal. No arrhythmia overnight.  CURRENT MEDS . heparin  5,000 Units Subcutaneous 3 times per day  . influenza vac split quadrivalent PF  0.5 mL Intramuscular Tomorrow-1000  . sodium chloride  3 mL Intravenous Q12H    OBJECTIVE   Intake/Output Summary (Last 24 hours) at 11/09/13 1206 Last data filed at 11/08/13 1300  Gross per 24 hour  Intake    360 ml  Output      0 ml  Net    360 ml   Filed Weights   11/08/13 0400  Weight: 67.8 kg (149 lb 7.6 oz)    PHYSICAL EXAM Filed Vitals:   11/09/13 0400 11/09/13 0543 11/09/13 0800 11/09/13 1149  BP:  99/50 111/49 108/59  Pulse:    73  Temp: 98 F (36.7 C)   98.9 F (37.2 C)  TempSrc: Oral   Oral  Resp:  22 22 24   Height:      Weight:      SpO2:  97% 97% 97%   General: Alert, oriented x3, no distress Head: no evidence of trauma, PERRL, EOMI, no exophtalmos or lid lag, no myxedema, no xanthelasma; normal ears, nose and oropharynx Neck: normal jugular venous pulsations and no hepatojugular reflux; brisk carotid pulses without delay and no carotid bruits Chest: clear to auscultation, no signs of consolidation by percussion or palpation, normal fremitus, symmetrical and full respiratory excursions Cardiovascular: normal position and quality of the apical impulse, regular rhythm, normal first and second heart sounds, no rubs or gallops, no murmur Abdomen: no tenderness or distention, no masses by palpation, no abnormal pulsatility or arterial bruits, normal bowel sounds, no hepatosplenomegaly Extremities: no clubbing, cyanosis or edema; 2+ radial, ulnar and brachial pulses bilaterally; 2+ right femoral, posterior tibial and dorsalis pedis pulses; 2+ left femoral, posterior tibial and dorsalis pedis pulses; no  subclavian or femoral bruits Neurological: grossly nonfocal  LABS  CBC  Recent Labs  11/07/13 2245 11/09/13 0850  WBC 9.1 6.7  NEUTROABS 8.3* 2.4  HGB 15.2 14.5  HCT 43.6 42.1  MCV 78.8 81.0  PLT 375 279   Basic Metabolic Panel  Recent Labs  11/07/13 2245 11/08/13 1035 11/09/13 0850  NA 138  --  140  K <2.2* 5.0 4.1  CL 99  --  100  CO2 17*  --  25  GLUCOSE 154*  --  157*  BUN 17  --  14  CREATININE 0.57  --  0.55  CALCIUM 10.1  --  9.9  MG 1.8  --  2.0   Liver Function Tests  Recent Labs  11/09/13 0850  AST 30  ALT 42  ALKPHOS 131*  BILITOT 0.6  PROT 6.8  ALBUMIN 3.4*   No results found for this basename: LIPASE, AMYLASE,  in the last 72 hours Cardiac Enzymes  Recent Labs  11/08/13 0030 11/08/13 2213 11/09/13 0850  TROPONINI 0.82* 0.72* 0.45*    Radiology Studies Imaging results have been reviewed and Dg Chest Port 1 View  11/07/2013   CLINICAL DATA:  Chest pain, throat pain and shortness of breath with tachycardia.  EXAM: PORTABLE CHEST - 1 VIEW  COMPARISON:  DG CHEST 1V PORT dated 01/22/2013  FINDINGS: Cardiomediastinal silhouette is unremarkable for this low inspiratory portable examination with crowded similarly engorged vasculature  markings. The lungs are otherwise clear without pleural effusions or focal consolidations. Decreased interstitial edema. Trachea projects midline and there is no pneumothorax. Stable right mid lung zone subcentimeter granuloma. Included soft tissue planes and osseous structures are non-suspicious. Multiple EKG lines overlie the patient and may obscure subtle underlying pathology. Gaseous distended stomach.  IMPRESSION: Similar pulmonary vasculature congestion/ decreased pulmonary edema.   Electronically Signed   By: Awilda Metroourtnay  Bloomer   On: 11/07/2013 23:02    TELE NSR  ECG ECG pending today ECG in SVT shows long (ish) R-P tachycardia During NSR yesterday , QT is markedly pronged and may be truly reflective of  hypokalemia No convincing evidence of hypokalemia  ASSESSMENT AND PLAN Prodrome of leg and arm weakness is consistent with hypokalemia induced paresis,so as unusual as the evolution of his K levels have been, he may truly have had severe hypokalemia. Note and episode of AF last year was also associated with (milder) hypokalemia. Antiarrhythmics are probably dangerous. For the most part, his AF episodes are rare or brief, so I am not sure that daily rate-control meds make sense either. Recommend ASA 81 mg daily. EP opinion would be valuable. Review echo, but suspect it will be normal. Check for K-losing nephropathy. No history to suggest GI K loss. He does not take diuretics or herbal remedies.  He does drink heavily on the weekends (12 beers), but usually only has 1 beer on weekdays. Could have some impact on AF, but doubt it is cause of hypokalemia.    Thurmon FairMihai Maximilliano Kersh, MD, Plastic And Reconstructive SurgeonsFACC [facility] [phone] office [phone] pager 11/09/2013 12:06 PM

## 2013-11-09 NOTE — Progress Notes (Signed)
[facility] PROGRESS NOTE  Andrew Duffy ZOX:[id number] DOB: [birth year] DOA: 11/07/2013 PCP: No PCP Per Patient  Assessment/Plan:  Hyperthyroidism -Obtain free T4/free T3 -Obtain thyroid ultrasound -start metoprolol 25 mg BID per cardiology recommendation  -Consult endocrinology after thyroid ultrasound obtained  SVT -Patient currently in normal sinus rhythm -Most likely secondary to his hyperthyroidism -  A. fib -Patient currently in normal sinus rhythm -See SVT  Elevated troponin -Cardiology was consulted -Echocardiogram obtained see results below  Hypokalemia/weakness -Patient's weakness has resolved with repletion of potassium     Code Status: Full  Family Communication: Family member present  Disposition Plan: Resolution of acute Hyperthyroidism   Consultants: Dr. Rick DuffBrooke Edmisten (electrophysiology) Dr. Thurmon FairMihai Croitoru (cardiology)   Procedures: Echocardiogram 11/09/2013 Left ventricle: The cavity size was normal. Wall thickness was normal. Systolic function was normal. LVEF= 60% to 65%.  - Left atrium: The atrium was normal in size. - Inferior vena cava: The vessel was normal in size; the respirophasic diameter changes were in the normal range (=50%); findings are consistent with normal central venous pressure. - Pericardium, extracardiac: There was no pericardial effusion.    Antibiotics:    HPI/Subjective: Andrew Duffy is a 28 y.o. HM PMHx none  presents to the ED for generalized weakness. Symptoms onset this morning and have been persistent throughout the day. Weakness is located in both arms and both legs, there is no numbness nor loss of sensation associated with the weakness in his arms or legs, he has minimal back pain which he relates to a fall while biking yesterday. Work up for his weakness in the ED reveals a critically low potassium of <2.2.  Additionally in ED, the patient apparently suddenly went into SVT (came in as normal  sinus), with a HR of 200. This was associated with vomiting. IV was initiated and he was given adenosine with long (longer than usual) asystolic pauses, and a brief episode of unresponsiveness. He then converted into A.Fib RVR which was then rate controlled with cardizem. He is now bouncing back and forth between rate controlled A.Fib and NSR at this time. 3/25 patient currently in NSR resting comfortably in bed negative CP, negative SOB (obtain review of symptoms via Spanish interpreter)   Objective: Filed Vitals:   11/09/13 1149 11/09/13 1200 11/09/13 1600 11/09/13 1657  BP: 108/59 108/59 112/92 112/92  Pulse: 73   76  Temp: 98.9 F (37.2 C)   98.6 F (37 C)  TempSrc: Oral   Oral  Resp: 24  17 25   Height:      Weight:      SpO2: 97% 97% 98% 98%    Intake/Output Summary (Last 24 hours) at 11/09/13 1843 Last data filed at 11/09/13 1700  Gross per 24 hour  Intake    480 ml  Output   1250 ml  Net   -770 ml   Filed Weights   11/08/13 0400  Weight: 67.8 kg (149 lb 7.6 oz)    Exam:   General:  A./O. x4, NAD  Cardiovascular: Regular rhythm and rate, negative murmurs rubs or gallops  Respiratory: Clear to auscultation bilateral  Abdomen: Soft, nontender, nondistended, plus bowel sound  Musculoskeletal: Negative pedal edema   Data Reviewed: Basic Metabolic Panel:  Recent Labs Lab 11/07/13 2245 11/08/13 1035 11/09/13 0850  NA 138  --  140  K <2.2* 5.0 4.1  CL 99  --  100  CO2 17*  --  25  GLUCOSE 154*  --  157*  BUN 17  --  14  CREATININE 0.57  --  0.55  CALCIUM 10.1  --  9.9  MG 1.8  --  2.0   Liver Function Tests:  Recent Labs Lab 11/09/13 0850  AST 30  ALT 42  ALKPHOS 131*  BILITOT 0.6  PROT 6.8  ALBUMIN 3.4*   No results found for this basename: LIPASE, AMYLASE,  in the last 168 hours No results found for this basename: AMMONIA,  in the last 168 hours CBC:  Recent Labs Lab 11/07/13 2245 11/09/13 0850  WBC 9.1 6.7  NEUTROABS 8.3* 2.4  HGB  15.2 14.5  HCT 43.6 42.1  MCV 78.8 81.0  PLT 375 279   Cardiac Enzymes:  Recent Labs Lab 11/07/13 2245 11/08/13 0030 11/08/13 2213 11/09/13 0850  TROPONINI 0.32* 0.82* 0.72* 0.45*   BNP (last 3 results) No results found for this basename: PROBNP,  in the last 8760 hours CBG:  Recent Labs Lab 11/07/13 2043  GLUCAP 160*    Recent Results (from the past 240 hour(s))  MRSA PCR SCREENING     Status: None   Collection Time    11/08/13  2:12 AM      Result Value Ref Range Status   MRSA by PCR NEGATIVE  NEGATIVE Final   Comment:            The GeneXpert MRSA Assay (FDA     approved for NASAL specimens     only), is one component of a     comprehensive MRSA colonization     surveillance program. It is not     intended to diagnose MRSA     infection nor to guide or     monitor treatment for     MRSA infections.     Studies: Dg Chest Port 1 View  11/07/2013   CLINICAL DATA:  Chest pain, throat pain and shortness of breath with tachycardia.  EXAM: PORTABLE CHEST - 1 VIEW  COMPARISON:  DG CHEST 1V PORT dated 01/22/2013  FINDINGS: Cardiomediastinal silhouette is unremarkable for this low inspiratory portable examination with crowded similarly engorged vasculature markings. The lungs are otherwise clear without pleural effusions or focal consolidations. Decreased interstitial edema. Trachea projects midline and there is no pneumothorax. Stable right mid lung zone subcentimeter granuloma. Included soft tissue planes and osseous structures are non-suspicious. Multiple EKG lines overlie the patient and may obscure subtle underlying pathology. Gaseous distended stomach.  IMPRESSION: Similar pulmonary vasculature congestion/ decreased pulmonary edema.   Electronically Signed   By: Awilda Metro   On: 11/07/2013 23:02    Scheduled Meds: . heparin  5,000 Units Subcutaneous 3 times per day  . influenza vac split quadrivalent PF  0.5 mL Intramuscular Tomorrow-1000  . metoprolol tartrate   25 mg Oral BID  . sodium chloride  3 mL Intravenous Q12H   Continuous Infusions:   Active Problems:   Hypokalemia   A-fib   SVT (supraventricular tachycardia)   Elevated troponin   Hyperthyroidism    Time spent: 45 minute   Lylianna Fraiser, J  [facility] Pager [phone]. If 7PM-7AM, please contact night-coverage at [email or web address], password [facility] 11/09/2013, 6:43 PM  LOS: 2 days

## 2013-11-10 ENCOUNTER — Inpatient Hospital Stay (HOSPITAL_COMMUNITY): Payer: Medicaid Other

## 2013-11-10 LAB — T3, FREE: T3 FREE: 7.8 pg/mL — AB (ref 2.3–4.2)

## 2013-11-10 LAB — T4, FREE: Free T4: 3.21 ng/dL — ABNORMAL HIGH (ref 0.80–1.80)

## 2013-11-10 LAB — T3 UPTAKE: T3 Uptake Ratio: 46 % — ABNORMAL HIGH (ref 22.5–37.0)

## 2013-11-10 LAB — T3: T3, Total: 235.4 ng/dl — ABNORMAL HIGH (ref 80.0–204.0)

## 2013-11-10 NOTE — Progress Notes (Signed)
[facility] PROGRESS NOTE  Andrew Duffy ZOX:[id number] DOB: 09-19-1985 DOA: 11/07/2013 PCP: No PCP Per Patient  Assessment/Plan:  Hyperthyroidism -free T4/free T3 both elevated -T3 uptake elevated -thyroid ultrasound pending -start metoprolol 25 mg BID per cardiology recommendation  -Dr. Adrian Prince (endocrinology) requested that we hold off on starting methimazole until thyroid scan is complete  SVT -Patient currently in normal sinus rhythm -Most likely secondary to his hyperthyroidism -  A. fib -Patient currently in normal sinus rhythm -See SVT  Elevated troponin -Cardiology was consulted -Echocardiogram obtained see results below  Hypokalemia/weakness -Patient's weakness has resolved with repletion of potassium     Code Status: Full  Family Communication: Family member present  Disposition Plan: Resolution of acute Hyperthyroidism   Consultants: Dr. Rick Duff (electrophysiology) Dr. Thurmon Fair (cardiology) Dr. Adrian Prince (endocrinology)     Procedures: Echocardiogram 11/09/2013 Left ventricle: The cavity size was normal. Wall thickness was normal. Systolic function was normal. LVEF= 60% to 65%.  - Left atrium: The atrium was normal in size. - Inferior vena cava: The vessel was normal in size; the respirophasic diameter changes were in the normal range (=50%); findings are consistent with normal central venous pressure. - Pericardium, extracardiac: There was no pericardial effusion.    Antibiotics:    HPI/Subjective: Andrew Duffy is a 28 y.o. HM PMHx none  presents to the ED for generalized weakness. Symptoms onset this morning and have been persistent throughout the day. Weakness is located in both arms and both legs, there is no numbness nor loss of sensation associated with the weakness in his arms or legs, he has minimal back pain which he relates to a fall while biking yesterday. Work up for his weakness in the ED  reveals a critically low potassium of <2.2.  Additionally in ED, the patient apparently suddenly went into SVT (came in as normal sinus), with a HR of 200. This was associated with vomiting. IV was initiated and he was given adenosine with long (longer than usual) asystolic pauses, and a brief episode of unresponsiveness. He then converted into A.Fib RVR which was then rate controlled with cardizem. He is now bouncing back and forth between rate controlled A.Fib and NSR at this time. 3/25 patient currently in NSR resting comfortably in bed negative CP, negative SOB (obtain review of symptoms via Spanish interpreter)3/26 patient with negative CP, negative SOB, all questions answered. Communicated through (Dr. Bosie Clos)   Objective: Filed Vitals:   11/10/13 1158 11/10/13 1200 11/10/13 1633 11/10/13 1900  BP: 113/66 113/66 92/56 109/58  Pulse:    70  Temp: 98.3 F (36.8 C)  98.1 F (36.7 C) 98.2 F (36.8 C)  TempSrc: Oral  Oral Oral  Resp: 25 19 16    Height:      Weight:      SpO2: 95%  96% 98%    Intake/Output Summary (Last 24 hours) at 11/10/13 2030 Last data filed at 11/10/13 1900  Gross per 24 hour  Intake    840 ml  Output      0 ml  Net    840 ml   Filed Weights   11/08/13 0400  Weight: 67.8 kg (149 lb 7.6 oz)    Exam:   General:  A./O. x4, NAD  Cardiovascular: Regular rhythm and rate, negative murmurs rubs or gallops  Respiratory: Clear to auscultation bilateral  Abdomen: Soft, nontender, nondistended, plus bowel sound  Musculoskeletal: Negative pedal edema   Data Reviewed: Basic Metabolic Panel:  Recent Labs Lab 11/07/13 2245  11/08/13 1035 11/09/13 0850  NA 138  --  140  K <2.2* 5.0 4.1  CL 99  --  100  CO2 17*  --  25  GLUCOSE 154*  --  157*  BUN 17  --  14  CREATININE 0.57  --  0.55  CALCIUM 10.1  --  9.9  MG 1.8  --  2.0   Liver Function Tests:  Recent Labs Lab 11/09/13 0850  AST 30  ALT 42  ALKPHOS 131*  BILITOT 0.6  PROT 6.8  ALBUMIN 3.4*    No results found for this basename: LIPASE, AMYLASE,  in the last 168 hours No results found for this basename: AMMONIA,  in the last 168 hours CBC:  Recent Labs Lab 11/07/13 2245 11/09/13 0850  WBC 9.1 6.7  NEUTROABS 8.3* 2.4  HGB 15.2 14.5  HCT 43.6 42.1  MCV 78.8 81.0  PLT 375 279   Cardiac Enzymes:  Recent Labs Lab 11/07/13 2245 11/08/13 0030 11/08/13 2213 11/09/13 0850  TROPONINI 0.32* 0.82* 0.72* 0.45*   BNP (last 3 results) No results found for this basename: PROBNP,  in the last 8760 hours CBG:  Recent Labs Lab 11/07/13 2043  GLUCAP 160*    Recent Results (from the past 240 hour(s))  MRSA PCR SCREENING     Status: None   Collection Time    11/08/13  2:12 AM      Result Value Ref Range Status   MRSA by PCR NEGATIVE  NEGATIVE Final   Comment:            The GeneXpert MRSA Assay (FDA     approved for NASAL specimens     only), is one component of a     comprehensive MRSA colonization     surveillance program. It is not     intended to diagnose MRSA     infection nor to guide or     monitor treatment for     MRSA infections.     Studies: [place] Soft Tissue Head/neck  11/10/2013   CLINICAL DATA:  Hyperthyroid  EXAM: THYROID ULTRASOUND  TECHNIQUE: Ultrasound examination of the thyroid gland and adjacent soft tissues was performed.  COMPARISON:  None.  FINDINGS: There is mild heterogeneity of thyroid parenchymal echotexture. There is no definitive abnormal vascularity this thyroid parenchyma (image 3).  Right thyroid lobe  Measurements: Mildly enlarged measuring 5.6 x 1.8 x 2.2 cm.  No discrete nodules identified within the right lobe of the thyroid.  Left thyroid lobe  Measurements: Mildly enlarged measuring 4.9 x 2.1 x 1.7 cm.  No discrete nodules are identified within the left lobe of the thyroid.  Isthmus  Thickness: Normal in size measuring 0.3 cm in diameter.  No discrete nodules are identified within the thyroid isthmus.  Lymphadenopathy  None  visualized.  IMPRESSION: Mildly enlarged and heterogeneous appearing thyroid gland without discrete nodule.   Electronically Signed   By: Simonne ComeJohn  Watts M.D.   On: 11/10/2013 09:21    Scheduled Meds: . heparin  5,000 Units Subcutaneous 3 times per day  . influenza vac split quadrivalent PF  0.5 mL Intramuscular Tomorrow-1000  . metoprolol tartrate  25 mg Oral BID  . sodium chloride  3 mL Intravenous Q12H   Continuous Infusions:   Active Problems:   Hypokalemia   A-fib   SVT (supraventricular tachycardia)   Elevated troponin   Hyperthyroidism    Time spent: 45 minute   Andrew Duffy, J  [facility] Pager [phone].  If 7PM-7AM, please contact night-coverage at [email or web address], password [facility] 11/10/2013, 8:30 PM  LOS: 3 days

## 2013-11-10 NOTE — Progress Notes (Signed)
Subjective: Asked to see pt by Dr Graciela HusbandsKlein re: thyrotoxicosis Presented with SVT, weakness and hypokalemia Now back in SR Denies palpitations or tremor today K now repleted No swallowing trouble. A little weight loss  Objective: Vital signs in last 24 hours: Temp:  [97.7 F (36.5 C)-98.9 F (37.2 C)] 97.9 F (36.6 C) (03/26 0759) Pulse Rate:  [60-76] 65 (03/26 0759) Resp:  [13-29] 13 (03/26 0759) BP: (102-115)/(41-92) 115/56 mmHg (03/26 0759) SpO2:  [93 %-98 %] 93 % (03/26 0759)  Intake/Output from previous day: 03/25 0701 - 03/26 0700 In: 600 [P.O.:600] Out: 1250 [Urine:1250] Intake/Output this shift:    Thin male in no distress. No exophthalmos or lid lag. Face symmetric, neck supple, no goiter felt. Lungs clear no wheeze, ht regular about 70 , abd soft NT, extrems good distal pulses no edema. No pretibial changes, no tremor  Lab Results   Recent Labs  11/07/13 2245 11/09/13 0850  WBC 9.1 6.7  RBC 5.53 5.20  HGB 15.2 14.5  HCT 43.6 42.1  MCV 78.8 81.0  MCH 27.5 27.9  RDW 12.5 13.4  PLT 375 279    Recent Labs  11/07/13 2245 11/08/13 1035 11/09/13 0850  NA 138  --  140  K <2.2* 5.0 4.1  CL 99  --  100  CO2 17*  --  25  GLUCOSE 154*  --  157*  BUN 17  --  14  CREATININE 0.57  --  0.55  CALCIUM 10.1  --  9.9   Results for Andrew Duffy, Andrew Duffy (MRN [id number]) as of 11/10/2013 08:25  Ref. Range 11/09/2013 20:24  Free T4 Latest Range: 0.80-1.80 ng/dL 1.193.21 (H)  T3, Free Latest Range: 2.3-4.2 pg/mL 7.8 (H)  T3, Total Latest Range: 80.0-204.0 ng/dl 147.8235.4 (H)  T3 Uptake Ratio Latest Range: 22.5-37.0 % 46.0 (H)   Studies/Results: No results found.  Scheduled Meds: . heparin  5,000 Units Subcutaneous 3 times per day  . influenza vac split quadrivalent PF  0.5 mL Intramuscular Tomorrow-1000  . metoprolol tartrate  25 mg Oral BID  . 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 Agree with thyroid US planned for today. Would plan thyroid scan and uptake as well. TFT's up but not tremendously high. Would favor antithyroid Rx with methimazole 20 mg once a day IF uptake is seen. Apparently rebound hyperkalemia is a common issue. Agree with beta blocker No endocrine eye or skin findings ECHO was normal   LOS: 3 days   Andrew Duffy 11/10/2013, 8:18 AM

## 2013-11-10 NOTE — Progress Notes (Signed)
Patient Name: Andrew Duffy Date of Encounter: 11/10/2013  Active Problems:   Hypokalemia   A-fib   SVT (supraventricular tachycardia)   Elevated troponin   Hyperthyroidism   Length of Stay: 3  SUBJECTIVE  No further arrhythmia. He wants to go home - feels great Surprising presentation of thyrotoxicosis  CURRENT MEDS . heparin  5,000 Units Subcutaneous 3 times per day  . influenza vac split quadrivalent PF  0.5 mL Intramuscular Tomorrow-1000  . metoprolol tartrate  25 mg Oral BID  . sodium chloride  3 mL Intravenous Q12H    OBJECTIVE   Intake/Output Summary (Last 24 hours) at 11/10/13 1102 Last data filed at 11/10/13 0900  Gross per 24 hour  Intake    600 ml  Output    850 ml  Net   -250 ml   Filed Weights   11/08/13 0400  Weight: 67.8 kg (149 lb 7.6 oz)    PHYSICAL EXAM Filed Vitals:   11/10/13 0300 11/10/13 0400 11/10/13 0759 11/10/13 0800  BP:  102/57 115/56 115/56  Pulse: 70  65   Temp: 97.7 F (36.5 C)  97.9 F (36.6 C)   TempSrc: Oral  Oral   Resp:  26 13 25   Height:      Weight:      SpO2: 95%  93%    General: Alert, oriented x3, no distress Head: no evidence of trauma, PERRL, EOMI, no exophtalmos or lid lag, no myxedema, no xanthelasma; normal ears, nose and oropharynx Neck: normal jugular venous pulsations and no hepatojugular reflux; brisk carotid pulses without delay and no carotid bruits Chest: clear to auscultation, no signs of consolidation by percussion or palpation, normal fremitus, symmetrical and full respiratory excursions Cardiovascular: normal position and quality of the apical impulse, regular rhythm, normal first and second heart sounds, no rubs or gallops, no murmur Abdomen: no tenderness or distention, no masses by palpation, no abnormal pulsatility or arterial bruits, normal bowel sounds, no hepatosplenomegaly Extremities: no clubbing, cyanosis or edema; 2+ radial, ulnar and brachial pulses bilaterally; 2+ right femoral,  posterior tibial and dorsalis pedis pulses; 2+ left femoral, posterior tibial and dorsalis pedis pulses; no subclavian or femoral bruits Neurological: grossly nonfocal  LABS  CBC  Recent Labs  11/07/13 2245 11/09/13 0850  WBC 9.1 6.7  NEUTROABS 8.3* 2.4  HGB 15.2 14.5  HCT 43.6 42.1  MCV 78.8 81.0  PLT 375 279   Basic Metabolic Panel  Recent Labs  11/07/13 2245 11/08/13 1035 11/09/13 0850  NA 138  --  140  K <2.2* 5.0 4.1  CL 99  --  100  CO2 17*  --  25  GLUCOSE 154*  --  157*  BUN 17  --  14  CREATININE 0.57  --  0.55  CALCIUM 10.1  --  9.9  MG 1.8  --  2.0   Liver Function Tests  Recent Labs  11/09/13 0850  AST 30  ALT 42  ALKPHOS 131*  BILITOT 0.6  PROT 6.8  ALBUMIN 3.4*   No results found for this basename: LIPASE, AMYLASE,  in the last 72 hours Cardiac Enzymes  Recent Labs  11/08/13 0030 11/08/13 2213 11/09/13 0850  TROPONINI 0.82* 0.72* 0.45*   BNP No components found with this basename: POCBNP,  D-Dimer No results found for this basename: DDIMER,  in the last 72 hours Hemoglobin A1C No results found for this basename: HGBA1C,  in the last 72 hours Fasting Lipid Panel No results found for this basename:  CHOL, HDL, LDLCALC, TRIG, CHOLHDL, LDLDIRECT,  in the last 72 hours Thyroid Function Tests  Recent Labs  11/09/13 0850 11/09/13 2024  TSH <0.008*  --   T3FREE  --  7.8*    Radiology Studies Imaging results have been reviewed and [place] Soft Tissue Head/neck  11/10/2013   CLINICAL DATA:  Hyperthyroid  EXAM: THYROID ULTRASOUND  TECHNIQUE: Ultrasound examination of the thyroid gland and adjacent soft tissues was performed.  COMPARISON:  None.  FINDINGS: There is mild heterogeneity of thyroid parenchymal echotexture. There is no definitive abnormal vascularity this thyroid parenchyma (image 3).  Right thyroid lobe  Measurements: Mildly enlarged measuring 5.6 x 1.8 x 2.2 cm.  No discrete nodules identified within the right lobe of the thyroid.   Left thyroid lobe  Measurements: Mildly enlarged measuring 4.9 x 2.1 x 1.7 cm.  No discrete nodules are identified within the left lobe of the thyroid.  Isthmus  Thickness: Normal in size measuring 0.3 cm in diameter.  No discrete nodules are identified within the thyroid isthmus.  Lymphadenopathy  None visualized.  IMPRESSION: Mildly enlarged and heterogeneous appearing thyroid gland without discrete nodule.   Electronically Signed   By: Simonne ComeJohn  Watts M.D.   On: 11/10/2013 09:21    TELE NSR  ASSESSMENT AND PLAN Hopefully, treatment of thyroid disorder will lead to resolution of both hypokalemia and cardiac arrhythmia. Meanwhile continue ASA 81 mg daily and metoprolol. When discharged, please let us know to arrange appropriate follow up in 3 months or so.   Thurmon FairMihai Ruari Mudgett, MD, [facility] [phone] office [phone] pager 11/10/2013 11:02 AM

## 2013-11-11 ENCOUNTER — Encounter (HOSPITAL_COMMUNITY): Payer: Medicaid Other

## 2013-11-11 MED ORDER — ATROPINE SULFATE 1 MG/ML IJ SOLN
INTRAMUSCULAR | Status: AC
  Filled 2013-11-11: qty 1

## 2013-11-11 MED ORDER — SODIUM PERTECHNETATE TC 99M INJECTION
10.0000 | Freq: Once | INTRAVENOUS | Status: AC | PRN
  Administered 2013-11-11: 10 via INTRAVENOUS

## 2013-11-11 MED ORDER — SODIUM IODIDE I 131 CAPSULE
12.0000 | Freq: Once | INTRAVENOUS | Status: AC | PRN
  Administered 2013-11-10: 12 via ORAL

## 2013-11-11 NOTE — Progress Notes (Signed)
Pt with reported episode of "feeling scared and bad" with "twitching like episode" in nuclear medicine; tele showed 6 second pause with HR prior to event and HR 70's immediately after event; pt alert and oriented with no symptoms of seizure or distress; VS per flowsheet; test completed with RN and interpretor at bedside; family at bedside; questions encouraged and pt and family reassured; will continue to monitor

## 2013-11-11 NOTE — Progress Notes (Signed)
     Objective: Vital signs in last 24 hours: Temp:  [97.8 F (36.6 C)-98.7 F (37.1 C)] 97.8 F (36.6 C) (03/27 0830) Pulse Rate:  [61-74] 74 (03/27 0400) Resp:  [16-28] 22 (03/27 0400) BP: (92-122)/(51-66) 102/52 mmHg (03/27 0830) SpO2:  [95 %-98 %] 98 % (03/27 0830)  Intake/Output from previous day: 03/26 0701 - 03/27 0700 In: 840 [P.O.:840] Out: -  Intake/Output this shift:      Lab Results   Recent Labs  11/09/13 0850  WBC 6.7  RBC 5.20  HGB 14.5  HCT 42.1  MCV 81.0  MCH 27.9  RDW 13.4  PLT 279    Recent Labs  11/08/13 1035 11/09/13 0850  NA  --  140  K 5.0 4.1  CL  --  100  CO2  --  25  GLUCOSE  --  157*  BUN  --  14  CREATININE  --  0.55  CALCIUM  --  9.9    Studies/Results: [place] Soft Tissue Head/neck  11/10/2013   CLINICAL DATA:  Hyperthyroid  EXAM: THYROID ULTRASOUND  TECHNIQUE: Ultrasound examination of the thyroid gland and adjacent soft tissues was performed.  COMPARISON:  None.  FINDINGS: There is mild heterogeneity of thyroid parenchymal echotexture. There is no definitive abnormal vascularity this thyroid parenchyma (image 3).  Right thyroid lobe  Measurements: Mildly enlarged measuring 5.6 x 1.8 x 2.2 cm.  No discrete nodules identified within the right lobe of the thyroid.  Left thyroid lobe  Measurements: Mildly enlarged measuring 4.9 x 2.1 x 1.7 cm.  No discrete nodules are identified within the left lobe of the thyroid.  Isthmus  Thickness: Normal in size measuring 0.3 cm in diameter.  No discrete nodules are identified within the thyroid isthmus.  Lymphadenopathy  None visualized.  IMPRESSION: Mildly enlarged and heterogeneous appearing thyroid gland without discrete nodule.   Electronically Signed   By: Simonne ComeJohn  Watts M.D.   On: 11/10/2013 09:21    Scheduled Meds: . heparin  5,000 Units Subcutaneous 3 times per day  . influenza vac split quadrivalent PF  0.5 mL Intramuscular Tomorrow-1000  . metoprolol tartrate  25 mg Oral BID  . sodium  chloride  3 mL Intravenous Q12H   Continuous Infusions:  PRN Meds:  Assessment/Plan: HYPERTHYROID: US essentially normal  IMPRESSION: Mildly enlarged and heterogeneous appearing thyroid gland without discrete nodule Await scan and uptake IF 1. Uptake is low, treat only with beta-blocker 2. If Uptake is 25% or more, start tapezole 20 mg daily with repeat labs needed in 3-4 weeks  HYPOKALEMIA/PERIODIC PARALYSIS: K good at 4.1   LOS: 4 days   Keegen Heffern [name] 11/11/2013, 9:31 AM

## 2013-11-11 NOTE — Progress Notes (Signed)
Patient ID: Andrew Duffy, male   DOB: [birth year], 28 y.o.   MRN: [id number] [facility] PROGRESS NOTE  Andrew Duffy [id number] DOB: [birth year] DOA: 11/07/2013 PCP: No PCP Per Patient  Brief narrative: 28 yo male with no past medical history to his knowledge presented to [facility] ED with main concern of sudden onset of weakness in upper and lower extremities that started one day after he feel of his bike. He has denied numbness and loss of sensation, no specific neurological deficits. In ED, K < 2.2, also noted to be in SVT with HR in 180 - 200's. Became briefly unresponsive and given adenosine, he subsequently converted to a-fib with RVR, pt started on Cardizem IV for rate control.   Assessment and plan: Hyperthyroidism  - free T4/free T3 both elevated  - T3 uptake elevated  - thyroid US essentially normal with mildly enlarged and heterogeneous appearing thyroid gland without discrete nodule  - appreciate Dr. Evlyn KannerSouth input  - per Dr. Evlyn KannerSouth, if Uptake is low, treat only with beta-blocker and if Uptake is > 25%, start tapezole 20 mg daily - repeat labs needed in 3-4 weeks SVT  - Most likely secondary to his hyperthyroidism  - management as noted above, with metoprolol  A. fib  - Patient currently in normal sinus rhythm  - See SVT  Elevated troponin  - Cardiology was consulted  - Echocardiogram obtained see results below  Hypokalemia/weakness  - Patient's weakness has resolved with repletion of potassium   Consultants:  Dr. Rick DuffBrooke Edmisten (electrophysiology)  Dr. Thurmon FairMihai Croitoru (cardiology)  Dr. Adrian PrinceStephen South (endocrinology)  Procedures:  Echocardiogram 11/09/2013 --> LVEF= 60% to 65%.  [place] Soft Tissue Head/neck   11/10/2013   Mildly enlarged and heterogeneous appearing thyroid gland without discrete nodule.   Antibiotics:  None  Code Status: Full Family Communication: Pt at bedside Disposition Plan: Home when medically stable  HPI/Subjective: No events overnight.    Objective: Filed Vitals:   11/10/13 2300 11/11/13 0400 11/11/13 0830 11/11/13 1126  BP: 97/51 100/61 102/52 123/65  Pulse: 61 74 70 70  Temp: 98.7 F (37.1 C) 97.8 F (36.6 C) 97.8 F (36.6 C) 97.7 F (36.5 C)  TempSrc: Oral Oral Oral Oral  Resp: 23 22 12 20   Height:      Weight:      SpO2: 96% 98% 98% 97%    Intake/Output Summary (Last 24 hours) at 11/11/13 1519 Last data filed at 11/11/13 0900  Gross per 24 hour  Intake    660 ml  Output      0 ml  Net    660 ml    Exam:   General:  Pt is alert, follows commands appropriately, not in acute distress  Cardiovascular: Regular rate and rhythm, S1/S2, no murmurs, no rubs, no gallops  Respiratory: Clear to auscultation bilaterally, no wheezing, no crackles, no rhonchi  Abdomen: Soft, non tender, non distended, bowel sounds present, no guarding  Extremities: No edema, pulses DP and PT palpable bilaterally  Neuro: Grossly nonfocal  Data Reviewed: Basic Metabolic Panel:  Recent Labs Lab 11/07/13 2245 11/08/13 1035 11/09/13 0850  NA 138  --  140  K <2.2* 5.0 4.1  CL 99  --  100  CO2 17*  --  25  GLUCOSE 154*  --  157*  BUN 17  --  14  CREATININE 0.57  --  0.55  CALCIUM 10.1  --  9.9  MG 1.8  --  2.0   Liver Function Tests:  Recent Labs Lab 11/09/13 0850  AST 30  ALT 42  ALKPHOS 131*  BILITOT 0.6  PROT 6.8  ALBUMIN 3.4*   CBC:  Recent Labs Lab 11/07/13 2245 11/09/13 0850  WBC 9.1 6.7  NEUTROABS 8.3* 2.4  HGB 15.2 14.5  HCT 43.6 42.1  MCV 78.8 81.0  PLT 375 279   Cardiac Enzymes:  Recent Labs Lab 11/07/13 2245 11/08/13 0030 11/08/13 2213 11/09/13 0850  TROPONINI 0.32* 0.82* 0.72* 0.45*   BNP: No components found with this basename: POCBNP,  CBG:  Recent Labs Lab 11/07/13 2043  GLUCAP 160*    Recent Results (from the past 240 hour(s))  MRSA PCR SCREENING     Status: None   Collection Time    11/08/13  2:12 AM      Result Value Ref Range Status   MRSA by PCR NEGATIVE   NEGATIVE Final   Comment:            The GeneXpert MRSA Assay (FDA     approved for NASAL specimens     only), is one component of a     comprehensive MRSA colonization     surveillance program. It is not     intended to diagnose MRSA     infection nor to guide or     monitor treatment for     MRSA infections.     Scheduled Meds: . atropine      . heparin  5,000 Units Subcutaneous 3 times per day  . influenza vac split quadrivalent PF  0.5 mL Intramuscular Tomorrow-1000  . metoprolol tartrate  25 mg Oral BID  . sodium chloride  3 mL Intravenous Q12H   Continuous Infusions:    [name], MD  [facility] Pager [phone]  If 7PM-7AM, please contact night-coverage [email or web address] Password [facility] 11/11/2013, 3:19 PM   LOS: 4 days

## 2013-11-11 NOTE — Significant Event (Signed)
Rapid Response Event Note  Overview: R[facility] team page to Nuclear medicine in radiology. Time Called: 1327 Arrival Time: 1329 Event Type: Cardiac  Initial Focused Assessment: On arrival patient supine on stretcher - awake and alert - NAD - speaking with interpreter on telephone - skin warm and dry - family member present.  Staff reports patient came down from 2H step down for iodine uptake scan.  They report patient was sitting in chair when he suddenly began to have a few "twitches" and then passed out.   He spontaneously awakened and was alert and oriented.  He was on telemetry monitor and staff reports that they saw his heart rate at 40. No post-ictal state - no incontinence.  Patient denies any pain, SOB - speaks no English and is very scared but no distress.  Monitor now shows SR rate 74 - first degree HB noted.  BP was 123/74  RR 20 O2 sat 98% on RA.     Interventions:  Chart reviewed - his staff RN Thayer OhmChris has arrived - she reports only event was in ER with administration of Adenosine for SVT - period of 30 second aystole.  Nuc med staff was not administering any medicines when event occurred.  Called central telemetry who report patient had a 6.1 second pause at same time as event occurred.  Asked them to post the strip.  Patient's RN Thayer OhmChris called Dr. Evlyn KannerSouth with report of event.  He feels ok with continuing with test but request primary MD to confer.  Thayer OhmChris spoke with Dr. Joelene MillinMagick-Meyers who conferred.  Patient to remain on stretcher for rest of test - Thayer OhmChris will remain with patient during test.  To call as needed.  Patient and family reassured.     Event Summary: Name of Physician Notified: Dr,. South at 1330  Name of Consulting Physician Notified: Dr. Joelene MillinMagick-Meyers at 1335  Outcome: Other (Comment) (patient in procedural area - room is on 2H will return there)  Event End Time: 1400  Andrew Duffy, Andrew Duffy

## 2013-11-12 DIAGNOSIS — E0590 Thyrotoxicosis, unspecified without thyrotoxic crisis or storm: Principal | ICD-10-CM

## 2013-11-12 LAB — CBC
HEMATOCRIT: 46 % (ref 39.0–52.0)
Hemoglobin: 16.3 g/dL (ref 13.0–17.0)
MCH: 28.3 pg (ref 26.0–34.0)
MCHC: 35.4 g/dL (ref 30.0–36.0)
MCV: 80 fL (ref 78.0–100.0)
PLATELETS: 346 10*3/uL (ref 150–400)
RBC: 5.75 MIL/uL (ref 4.22–5.81)
RDW: 12.5 % (ref 11.5–15.5)
WBC: 7.5 10*3/uL (ref 4.0–10.5)

## 2013-11-12 LAB — BASIC METABOLIC PANEL
BUN: 15 mg/dL (ref 6–23)
CO2: 25 mEq/L (ref 19–32)
CREATININE: 0.6 mg/dL (ref 0.50–1.35)
Calcium: 10 mg/dL (ref 8.4–10.5)
Chloride: 97 mEq/L (ref 96–112)
GFR calc Af Amer: >90 mL/min (ref >90)
GFR calc non Af Amer: >90 mL/min (ref >90)
Glucose, Bld: 99 mg/dL (ref 70–99)
Potassium: 4.8 mEq/L (ref 3.7–5.3)
Sodium: 136 mEq/L — ABNORMAL LOW (ref 137–147)

## 2013-11-12 MED ORDER — METHIMAZOLE 10 MG PO TABS: 20.0000 mg | ORAL_TABLET | Freq: Every day | ORAL | Status: DC

## 2013-11-12 MED ORDER — METOPROLOL TARTRATE 25 MG PO TABS: 25.0000 mg | ORAL_TABLET | Freq: Two times a day (BID) | ORAL | Status: DC

## 2013-11-12 MED ORDER — METHIMAZOLE 10 MG PO TABS
20.0000 mg | ORAL_TABLET | Freq: Every day | ORAL | Status: DC
  Administered 2013-11-12: 20 mg via ORAL
  Filled 2013-11-12: qty 2

## 2013-11-12 NOTE — Discharge Instructions (Signed)
Hipertiroidismo  (Hyperthyroidism)  La tiroides es una glándula grande ubicada en la parte anterior e inferior del cuello. La tiroides interviene en el control del metabolismo. El metabolismo es el modo en que el organismo utiliza los alimentos. El control del metabolismo se realiza a través de una hormona denominada tiroxina. Cuando la tiroides es hiperactiva, produce demasiada hormona. Cuando esto ocurre pueden surgir los siguientes problemas:   · Nerviosismo  · Intolerancia al calor  · Pérdida de peso (a pesar del aumento de la ingesta de comida)  · Diarrea  · Cambios en la textura del cabello o de la piel  · Palpitaciones (falta de algunos latidos cardíacos o latidos extra)  · Taquicardia (frecuencia cardíaca acelerada)  · Falta de menstruación (amenorrea)  · Temblor en las manos  CAUSAS  · Enfermedad de Graves (el sistema inmune ataca a la glándula tiroides). Ésta es la causa más frecuente.  · Inflamación de la glándula tiroides.  · Tumor (generalmente benigno) de la glándula tiroides o ubicado en otro lugar.  · Uso excesivo de medicamentos para la tiroides (tanto prescriptos como 'naturales')  · Ingesta excesiva de iodo.  DIAGNÓSTICO  Para confirmar el hipertiroidismo, el profesional que lo asiste le solicitará análisis de sangre y estudios con ultrasonido. Algunas veces los signos están ocultos. Puede ser necesario que el profesional controle la enfermedad con exámenes de sangre, ya sea antes o después del diagnóstico y tratamiento.  TRATAMIENTO  Tratamiento a corto plazo  Existen varios tratamientos para controlar los síntomas. Los medicamentos beta bloqueantes podrán proporcionar cierto alivio. Los medicamentos que disminuyen la producción de hormonas proporcionarán a muchas personas un alivio temporal Estas medidas en general no ofrecen una mejoría permanente.  Tratamiento definitivo   Se dispone de varios tratamientos que el profesional que lo asiste podrá comentar con usted y que tratarán el problema de  manera permanente. Estos tratamientos varían desde la cirugía (extirpación de la tiroides) o el uso de yodo radiactivo (que destruye la tiroides por radiación), hasta el uso de medicamentos antitiroideos (que interfieren en la síntesis de la hormona). Los dos primeros tratamientos son permanentes y generalmente exitosos. Habitualmente requieren el suministro de hormona de por vida. Esto es debido a que es imposible retirar o destruir la cantidad exacta de tiroides que se necesita para que la persona quede eutiroidea (normal).  INSTRUCCIONES PARA EL CUIDADO DOMICILIARIO  Consulte con el profesional que lo asiste si el problema por el que lo trata empeora. Algunos ejemplos serían los trastornos ya mencionados.  SOLICITE ATENCIÓN MÉDICA SI:  El trastorno general empeora.  ESTÉ SEGURO QUE:   · Comprende las instrucciones para el alta médica.  · Controlará su enfermedad.  · Solicitará atención médica de inmediato según las indicaciones.  Document Released: 08/04/2005 Document Revised: 10/27/2011  ExitCare® Patient Information ©2014 [employer].

## 2013-11-12 NOTE — Discharge Summary (Signed)
Physician Discharge Summary  Andrew Duffy [id number] DOB: 07-Apr-1986 DOA: 11/07/2013  PCP: No PCP Per Patient  Admit date: 11/07/2013 Discharge date: 11/12/2013  Recommendations for Outpatient Follow-up:  1. Pt will need to follow up with PCP in 2-3 weeks post discharge 2. Please obtain BMP to evaluate electrolytes and kidney function 3. Please also check CBC to evaluate Hg and Hct levels 4. Please note since uptake > 25%, recommendation was to start tapazole per endocrinologist 5. Pt advised to follow up with Dr. Evlyn KannerSouth, and to call for an appointment as soon as possible 6. Pt will need thyroid function tests repeated in 4-6 weeks   Discharge Diagnoses: Hyperthyroidism  Active Problems:   Hypokalemia   A-fib   SVT (supraventricular tachycardia)   Elevated troponin   Hyperthyroidism  Discharge Condition: Stable  Diet recommendation: Heart healthy diet discussed in details   Brief narrative:  28 yo male with no past medical history to his knowledge [facility] with main concern of sudden onset of weakness in upper and lower extremities that started one day after he feel of his bike. He has denied numbness and loss of sensation, no specific neurological deficits. In ED, K < 2.2, also noted to be in SVT with HR in 180 - 200's. Became briefly unresponsive and given adenosine, he subsequently converted to a-fib with RVR, pt started on Cardizem IV for rate control.   Assessment and plan:  Hyperthyroidism  - free T4/free T3 both elevated  - T3 uptake elevated  - thyroid US essentially normal with mildly enlarged and heterogeneous appearing thyroid gland without discrete nodule  - appreciate Dr. Evlyn KannerSouth input  - per Dr. Evlyn KannerSouth, since Uptake is > 25%, start tapezole 20 mg daily  - repeat labs needed in 3-4 weeks  SVT  - Most likely secondary to his hyperthyroidism  - management as noted above, with metoprolol  A. fib  - Patient currently in normal sinus rhythm  - See SVT   Elevated troponin  - Cardiology was consulted  - Echocardiogram obtained see results below  Hypokalemia/weakness  - Patient's weakness has resolved with repletion of potassium   Consultants:  Dr. Rick DuffBrooke Edmisten (electrophysiology)  Dr. Thurmon FairMihai Croitoru (cardiology)  Dr. Adrian PrinceStephen South (endocrinology)  Procedures:  Echocardiogram 11/09/2013 --> LVEF= 60% to 65%.  [place] Soft Tissue Head/neck 11/10/2013 Mildly enlarged and heterogeneous appearing thyroid gland without discrete nodule.  Antibiotics:  None  Code Status: Full  Family Communication: Pt and wife at bedside  Disposition Plan: Home today    Discharge Exam: Filed Vitals:   11/12/13 1200  BP: 106/59  Pulse:   Temp:   Resp: 18   Filed Vitals:   11/12/13 0700 11/12/13 0747 11/12/13 1158 11/12/13 1200  BP: 109/68 100/70 106/59 106/59  Pulse:  95    Temp:  97.6 F (36.4 C) 97.8 F (36.6 C)   TempSrc:  Oral Oral   Resp: 20 17 20 18   Height:      Weight:      SpO2:  98% 99%     General: Pt is alert, follows commands appropriately, not in acute distress Cardiovascular: Regular rate and rhythm, S1/S2 +, no murmurs, no rubs, no gallops Respiratory: Clear to auscultation bilaterally, no wheezing, no crackles, no rhonchi Abdominal: Soft, non tender, non distended, bowel sounds +, no guarding Extremities: no edema, no cyanosis, pulses palpable bilaterally DP and PT Neuro: Grossly nonfocal  Discharge Instructions  Discharge Orders   Future Orders Complete By Expires  Diet - low sodium heart healthy  As directed    Increase activity slowly  As directed        Medication List         acetaminophen 500 MG tablet  Commonly known as:  TYLENOL  Take 1,000 mg by mouth every 6 (six) hours as needed for headache.     indomethacin 25 MG capsule  Commonly known as:  INDOCIN  Take 25 mg by mouth 3 (three) times daily with meals.     methimazole 10 MG tablet  Commonly known as:  TAPAZOLE  Take 2 tablets (20 mg total) by  mouth daily.     metoprolol tartrate 25 MG tablet  Commonly known as:  LOPRESSOR  Take 1 tablet (25 mg total) by mouth 2 (two) times daily.           Follow-up Information   Schedule an appointment as soon as possible for a visit with Julian Hy, MD.   Specialty:  Endocrinology   Contact information:   [address] [place] [address] [phone]       Follow up with [name], MD. (call my cell with questions [phone])    Specialty:  Internal Medicine   Contact information:   [address] [place] [address] [phone]        The results of significant diagnostics from this hospitalization (including imaging, microbiology, ancillary and laboratory) are listed below for reference.     Microbiology: Recent Results (from the past 240 hour(s))  MRSA PCR SCREENING     Status: None   Collection Time    11/08/13  2:12 AM      Result Value Ref Range Status   MRSA by PCR NEGATIVE  NEGATIVE Final   Comment:            The GeneXpert MRSA Assay (FDA     approved for NASAL specimens     only), is one component of a     comprehensive MRSA colonization     surveillance program. It is not     intended to diagnose MRSA     infection nor to guide or     monitor treatment for     MRSA infections.     Labs: Basic Metabolic Panel:  Recent Labs Lab 11/07/13 2245 11/08/13 1035 11/09/13 0850 11/12/13 0336  NA 138  --  140 136*  K <2.2* 5.0 4.1 4.8  CL 99  --  100 97  CO2 17*  --  25 25  GLUCOSE 154*  --  157* 99  BUN 17  --  14 15  CREATININE 0.57  --  0.55 0.60  CALCIUM 10.1  --  9.9 10.0  MG 1.8  --  2.0  --    Liver Function Tests:  Recent Labs Lab 11/09/13 0850  AST 30  ALT 42  ALKPHOS 131*  BILITOT 0.6  PROT 6.8  ALBUMIN 3.4*   CBC:  Recent Labs Lab 11/07/13 2245 11/09/13 0850 11/12/13 0336  WBC 9.1 6.7 7.5  NEUTROABS 8.3* 2.4  --   HGB 15.2 14.5 16.3  HCT 43.6 42.1 46.0  MCV 78.8 81.0 80.0  PLT 375 279  346   Cardiac Enzymes:  Recent Labs Lab 11/07/13 2245 11/08/13 0030 11/08/13 2213 11/09/13 0850  TROPONINI 0.32* 0.82* 0.72* 0.45*   BNP: BNP (last 3 results) No results found for this basename: PROBNP,  in the last 8760 hours CBG:  Recent Labs Lab 11/07/13 2043  GLUCAP 160*     SIGNED: Time coordinating discharge: Over 30 minutes  [name], MD  [facility] 11/12/2013, 3:02 PM Pager [phone]  If 7PM-7AM, please contact night-coverage [email or web address] Password [email or web address]

## 2013-11-12 NOTE — Progress Notes (Signed)
    Subjective:  Feels well. No CP, no SOB.  Syncope in radiology yesterday. 6 second pause. Gradual PR prolongation. -Vagal.   Objective:  Vital Signs in the last 24 hours: Temp:  [97.6 F (36.4 C)-98.4 F (36.9 C)] 97.8 F (36.6 C) (03/28 1158) Pulse Rate:  [83-95] 95 (03/28 0747) Resp:  [14-29] 20 (03/28 1158) BP: (86-129)/(44-77) 106/59 mmHg (03/28 1158) SpO2:  [94 %-99 %] 99 % (03/28 1158)  Intake/Output from previous day: 03/27 0701 - 03/28 0700 In: 650 [P.O.:650] Out: -    Physical Exam: General: Well developed, well nourished, in no acute distress. Head:  Normocephalic and atraumatic. Lungs: Clear to auscultation and percussion. Heart: Normal S1 and S2.  No murmur, rubs or gallops.  Abdomen: soft, non-tender, positive bowel sounds. Extremities: No clubbing or cyanosis. No edema. Neurologic: Alert and oriented x 3.    Lab Results:  Recent Labs  11/12/13 0336  WBC 7.5  HGB 16.3  PLT 346    Recent Labs  11/12/13 0336  NA 136*  K 4.8  CL 97  CO2 25  GLUCOSE 99  BUN 15  CREATININE 0.60   Imaging: Nm Thyroid Sng Uptake W/imaging  11/11/2013   CLINICAL DATA:  Abnormal cardiac rhythm.  EXAM: THYROID SCAN AND UPTAKE - 24 HOURS  TECHNIQUE: Following the per oral administration of I-131 sodium iodide, the patient returned at 24 hours and uptake measurements were acquired with the uptake probe centered on the neck. Thyroid imaging was performed following the intravenous administration of the Tc-1953m Pertechnetate.  RADIOPHARMACEUTICALS:  12 microCuries I-131 Sodium Iodide and 10 mCi TC-8053m Pertechnetate  COMPARISON:  Thyroid ultrasound 11/10/2013.  FINDINGS: Diffuse thyroid gland enlargement with increased radioactive iodine uptake noted. 24 hr uptake is 41.5%. Patient experienced difficulty during the procedure requiring rapid response. The patient's physician allowed the procedure to continue .  IMPRESSION: Findings consistent with Graves disease.   Electronically  Signed   By: Maisie Fushomas  Register   On: 11/11/2013 15:44    Telemetry: 6 sec pause. During uptake scan.  Personally viewed.  Cardiac Studies:  Normal EF  Assessment/Plan:  Active Problems:   Hypokalemia   A-fib   SVT (supraventricular tachycardia)   Elevated troponin   Hyperthyroidism  1) Pause - likely vagal response. Appreciate Dr. Graciela HusbandsKlein looking at strips with nursing. No pacing. NSR currently.   2) AFIB - PAF - no longer. Treat hyperthyroid  Will need follow up with Dr. Rubie Maidroituro in 3 months.    Andrew Duffy, Andrew Duffy 11/12/2013, 1:08 PM

## 2013-11-14 NOTE — Consult Note (Signed)
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

## 2013-11-29 ENCOUNTER — Ambulatory Visit: Payer: Self-pay

## 2013-12-01 ENCOUNTER — Encounter: Payer: Self-pay | Admitting: Endocrinology

## 2013-12-01 ENCOUNTER — Ambulatory Visit (INDEPENDENT_AMBULATORY_CARE_PROVIDER_SITE_OTHER): Payer: Medicaid Other | Admitting: Endocrinology

## 2013-12-01 VITALS — BP 124/68 | HR 77 | Temp 97.9°F | Resp 16 | Ht 66.0 in | Wt 145.4 lb

## 2013-12-01 DIAGNOSIS — E0590 Thyrotoxicosis, unspecified without thyrotoxic crisis or storm: Secondary | ICD-10-CM

## 2013-12-01 NOTE — Progress Notes (Signed)
Patient ID: Andrew Duffy, male   DOB: 05/15/1986, 28 y.o.   MRN: [id number]                                                                                                              Andrew Duffy [age] y.o.   Reason for Appointment:  Hyperthyroidism, new consultation   History of Present Illness:   The patient started having symptoms of  palpitations, shakiness, feeling excessively warm and sweaty, nervousness, and fatigue.  He also complains of feeling excessively hungry and some weakness in his muscles He started having most of the symptoms several months ago but has not tried to get attention for this. He may have had periodic palpitations over the last 5 years In 6/14 he had a visit to the emergency room for transient atrial fibrillation but apparently did not get any thyroid evaluation at that time He has not had any weight loss The patient presented to the emergency room with profound weakness in 3/15 and was found to have potassium of 2.9 along with evidence of hyperthyroidism  Lab Results  Component Value Date   FREET4 3.21* 11/09/2013   TSH <0.008* 11/09/2013       Medication List       This list is accurate as of: 12/01/13  8:58 AM.  Always use your most recent med list.               acetaminophen 500 MG tablet  Commonly known as:  TYLENOL  Take 1,000 mg by mouth every 6 (six) hours as needed for headache.     indomethacin 25 MG capsule  Commonly known as:  INDOCIN  Take 25 mg by mouth 3 (three) times daily with meals.     methimazole 10 MG tablet  Commonly known as:  TAPAZOLE  Take 2 tablets (20 mg total) by mouth daily.     metoprolol tartrate 25 MG tablet  Commonly known as:  LOPRESSOR  Take 1 tablet (25 mg total) by mouth 2 (two) times daily.            Past Medical History  Diagnosis Date  . SVT (supraventricular tachycardia)   . Atrial fibrillation     Following treatment of SVT with adenosine    Past Surgical History  Procedure  Laterality Date  . None      No family history on file.  Social History:  reports that he has been smoking Cigarettes.  He has been smoking about 0.00 packs per day. He does not have any smokeless tobacco history on file. He reports that he does not drink alcohol or use illicit drugs.  Allergies: No Known Allergies  Review of Systems:  Has no  history of high blood pressure.       No history of dyspnea on exertion.      No complaints of change in bowel habits.      Negative  history of Diabetes.     No swelling of feet  Examination:   BP 124/68  Pulse 77  Temp(Src) 97.9 F (36.6 C)  Resp 16  Ht 5\' 6"  (1.676 m)  Wt 145 lb 6.4 oz (65.953 kg)  BMI 23.48 kg/m2  SpO2 95%   General Appearance:  averagely built and nourished, pleasant, not anxious or hyperkinetic..        Eyes: No excessive prominence, does have mild lid lag but no stare. No swelling of the eyelids  Neck: The thyroid is enlarged about twice normal on the right side, smooth, firm. Not palpable on the left There is no lymphadenopathy .          Heart: normal S1 and S2, no murmurs .         Lungs: breath sounds are clear bilaterally Abdomen: No hepatosplenomegaly  Extremities: hands are warm. No ankle edema. Neurological: REFLEXES: at biceps are normal, somewhat difficult to elicit .   TREMORS:  no tremors are present..    Assessment/Plan:   Hyperthyroidism, Likely to be from Graves' disease Given his history he probably has had uncontrolled hyperthyroidism for several years  Also has history of SVT and atrial fibrillation in the past as well as hypokalemic periodic paralysis Currently has been started on antithyroid drugs with some subjective improvement Heart rate is controlled with only low doses of Lopressor indicating thyroid function probably near normal He does have a small thyroid enlargement  Discussed with the patient through the interpreter the causative factor of hyperthyroidism as being  autoimmune thyroid disease  Explain to him the options for treatment and that given his history and to simplify his treatment he should have radioactive iodine He has already had an uptake test done which showed the result to be 41% Given him detailed information on hyperthyroidism and treatment of this with I-131 (translated for his benefit) Discussed long-term hypothyroidism that will result from I-131 treatment and the need for lifelong supplementation. He understood  He will be ordered 17 mCi of I-131 Also gave him detailed written instructions on stopping the methimazole a week before the treatment and starting the dose 3 days later until his next visit for followup. He have followup thyroid levels on the next visit   Reather Littlerjay Vlad Mayberry 12/01/2013, 8:58 AM

## 2013-12-01 NOTE — Patient Instructions (Addendum)
Detener Metimazol 1 semana antes de la fecha de tratamiento con yodo radiactivo y reinicie 3 das despus del tratamiento Permanezca en metoprolol

## 2013-12-23 ENCOUNTER — Encounter (HOSPITAL_COMMUNITY)
Admission: RE | Admit: 2013-12-23 | Discharge: 2013-12-23 | Disposition: A | Discharge status: Home / Self Care | Payer: Medicaid Other | Source: Ambulatory Visit | Attending: Endocrinology | Admitting: Endocrinology

## 2013-12-23 DIAGNOSIS — E0590 Thyrotoxicosis, unspecified without thyrotoxic crisis or storm: Secondary | ICD-10-CM | POA: Insufficient documentation

## 2013-12-23 MED ORDER — SODIUM IODIDE I 131 CAPSULE: 17.8000 | Freq: Once | INTRAVENOUS | Status: AC | PRN

## 2014-01-06 ENCOUNTER — Other Ambulatory Visit (INDEPENDENT_AMBULATORY_CARE_PROVIDER_SITE_OTHER): Payer: Self-pay

## 2014-01-06 DIAGNOSIS — E0590 Thyrotoxicosis, unspecified without thyrotoxic crisis or storm: Secondary | ICD-10-CM

## 2014-01-06 LAB — T4, FREE: FREE T4: 2.42 ng/dL — AB (ref 0.60–1.60)

## 2014-01-11 ENCOUNTER — Ambulatory Visit (INDEPENDENT_AMBULATORY_CARE_PROVIDER_SITE_OTHER): Payer: Self-pay | Admitting: Endocrinology

## 2014-01-11 ENCOUNTER — Encounter: Payer: Self-pay | Admitting: Endocrinology

## 2014-01-11 VITALS — BP 122/64 | HR 91 | Temp 98.3°F | Resp 14 | Ht 66.0 in | Wt 144.8 lb

## 2014-01-11 DIAGNOSIS — E0590 Thyrotoxicosis, unspecified without thyrotoxic crisis or storm: Secondary | ICD-10-CM

## 2014-01-11 MED ORDER — METHIMAZOLE 10 MG PO TABS: 20.0000 mg | ORAL_TABLET | Freq: Every day | ORAL | Status: DC

## 2014-01-11 MED ORDER — METOPROLOL TARTRATE 25 MG PO TABS: 25.0000 mg | ORAL_TABLET | Freq: Two times a day (BID) | ORAL | Status: DC

## 2014-01-11 NOTE — Patient Instructions (Signed)
On 01/18/14 reduce methimazole to 1 daily and on 6/11 stop

## 2014-01-11 NOTE — Progress Notes (Signed)
Patient ID: Andrew Duffy, male   DOB: 05/20/1986, 28 y.o.   MRN: [id number]                                                                                                              Andrew Duffy 28 y.o.   Reason for Appointment:  Hyperthyroidism, followup   History of Present Illness:   Initially had symptoms of  palpitations, shakiness, feeling excessively warm and sweaty, nervousness, and fatigue.  He was feeling excessively hungry and had some weakness in his muscles and these were present for several months He started having most of the symptoms several months ago but has not tried to get attention for this. The patient was also seen in the emergency room with profound weakness in 3/15 and was found to have potassium of 2.9 along with evidence of hyperthyroidism  He agreed to take the I-131 treatment which was done on 12/23/13. He was treated with methimazole before and has taken this again after the treatment using 10 mg twice a day Subjectively he feels overall better although still occasionally feels nervous, may feel weak in the mornings and have occasional palpitations.  Lab Results  Component Value Date   FREET4 2.42* 01/06/2014   FREET4 3.21* 11/09/2013   TSH <0.008* 11/09/2013       Medication List       This list is accurate as of: 01/11/14 10:28 AM.  Always use your most recent med list.               acetaminophen 500 MG tablet  Commonly known as:  TYLENOL  Take 1,000 mg by mouth every 6 (six) hours as needed for headache.     indomethacin 25 MG capsule  Commonly known as:  INDOCIN  Take 25 mg by mouth 3 (three) times daily with meals.     methimazole 10 MG tablet  Commonly known as:  TAPAZOLE  Take 2 tablets (20 mg total) by mouth daily.     metoprolol tartrate 25 MG tablet  Commonly known as:  LOPRESSOR  Take 1 tablet (25 mg total) by mouth 2 (two) times daily.            Past Medical History  Diagnosis Date  . SVT (supraventricular  tachycardia)   . Atrial fibrillation     Following treatment of SVT with adenosine    Past Surgical History  Procedure Laterality Date  . None      No family history on file.  Social History:  reports that he has been smoking Cigarettes.  He has been smoking about 0.00 packs per day. He does not have any smokeless tobacco history on file. He reports that he does not drink alcohol or use illicit drugs.  Allergies: No Known Allergies  Review of Systems:  Has no  history of high blood pressure.             Examination:   BP 122/64  Pulse 91  Temp(Src) 98.3 F (36.8 C)  Resp 14  Ht  5\' 6"  (1.676 m)  Wt 144 lb 12.8 oz (65.681 kg)  BMI 23.38 kg/m2  SpO2 97%  Repeat pulse 84  General Appearance:  averagely built and nourished, pleasant, not anxious or hyperkinetic..        Eyes: No excessive prominence. No swelling of the eyelids  Neck: The thyroid is just palpable on the right side and firm  REFLEXES: at biceps are normal, somewhat difficult to elicit .   TREMORS:  no tremors are present..    Assessment/Plan:   Hyperthyroidism from Graves' disease He has just been treated with I-131 and if subjectively doing better Also his goiter is smaller Although he has a high free T4 level his objective signs are less prominent  History of SVT: Appears to be in sinus rhythm with controlled rate  Will plan to continue his methimazole for another 2 weeks and low dose metoprolol until the next visit, instructions given through interpreter  Reather Littler 01/11/2014, 10:28 AM

## 2014-02-01 ENCOUNTER — Ambulatory Visit: Payer: Self-pay | Attending: Internal Medicine

## 2014-02-20 ENCOUNTER — Other Ambulatory Visit (INDEPENDENT_AMBULATORY_CARE_PROVIDER_SITE_OTHER): Payer: Self-pay

## 2014-02-20 ENCOUNTER — Other Ambulatory Visit: Payer: Self-pay

## 2014-02-20 DIAGNOSIS — E0590 Thyrotoxicosis, unspecified without thyrotoxic crisis or storm: Secondary | ICD-10-CM

## 2014-02-20 LAB — TSH: TSH: 8.04 u[IU]/mL — ABNORMAL HIGH (ref 0.35–4.50)

## 2014-02-20 LAB — T4, FREE: Free T4: 0.19 ng/dL — ABNORMAL LOW (ref 0.60–1.60)

## 2014-02-22 ENCOUNTER — Ambulatory Visit (INDEPENDENT_AMBULATORY_CARE_PROVIDER_SITE_OTHER): Payer: Self-pay | Admitting: Endocrinology

## 2014-02-22 ENCOUNTER — Encounter: Payer: Self-pay | Admitting: Endocrinology

## 2014-02-22 VITALS — BP 117/70 | HR 58 | Temp 98.4°F | Resp 14 | Ht 66.0 in | Wt 154.4 lb

## 2014-02-22 DIAGNOSIS — E890 Postprocedural hypothyroidism: Secondary | ICD-10-CM

## 2014-02-22 MED ORDER — LEVOTHYROXINE SODIUM 112 MCG PO TABS: 112.0000 ug | ORAL_TABLET | Freq: Every day | ORAL | Status: DC

## 2014-02-22 NOTE — Patient Instructions (Signed)
Start Levothyroxine 112ug daily in am before eating, will need to take this lifelong  Stop Metoprolol  Iniciar 112ug levotiroxina a diario en la maana antes de comer, tendr que tomar esta vida

## 2014-02-22 NOTE — Progress Notes (Signed)
Patient ID: Andrew Duffy, male   DOB: 1986/02/26, 28 y.o.   MRN: [id number]                                                                                                              Andrew Duffy 28 y.o.   Reason for Appointment:  Hyperthyroidism, followup   History of Present Illness:    Background History: Initially had symptoms of  palpitations, shakiness, feeling excessively warm and sweaty, nervousness, and fatigue.  He was feeling excessively hungry and had some weakness in his muscles and these were present for several months He started having most of the symptoms several months ago but has not tried to get attention for this. The patient was also seen in the emergency room with profound weakness in 3/15 and was found to have potassium of 2.9 along with evidence of hyperthyroidism  He agreed to take the I-131 treatment which was done on 12/23/13. He was treated with methimazole before and after the treatment but even though he was told to stop it after 2 weeks on his last visit he still continued this until 2 days ago  Has not stopped metoprolol He does feel somewhat tired now and he feels that his voice is somewhat deeper. Feels a little fluttering in his neck has gained 10 pounds    Lab Results  Component Value Date   FREET4 0.19* 02/20/2014   FREET4 2.42* 01/06/2014   TSH 8.04* 02/20/2014   TSH <0.008* 11/09/2013       Medication List       This list is accurate as of: 02/22/14 11:11 AM.  Always use your most recent med list.               acetaminophen 500 MG tablet  Commonly known as:  TYLENOL  Take 1,000 mg by mouth every 6 (six) hours as needed for headache.     methimazole 10 MG tablet  Commonly known as:  TAPAZOLE  Take 2 tablets (20 mg total) by mouth daily.     metoprolol tartrate 25 MG tablet  Commonly known as:  LOPRESSOR  Take 1 tablet (25 mg total) by mouth 2 (two) times daily.            Past Medical History  Diagnosis Date  . SVT  (supraventricular tachycardia)   . Atrial fibrillation     Following treatment of SVT with adenosine    Past Surgical History  Procedure Laterality Date  . None      No family history on file.  Social History:  reports that he has been smoking Cigarettes.  He has been smoking about 0.00 packs per day. He does not have any smokeless tobacco history on file. He reports that he does not drink alcohol or use illicit drugs.  Allergies: No Known Allergies  Review of Systems:  Has no  history of high blood pressure.             Examination:   BP 117/70  Pulse 58  Temp(Src)  98.4 F (36.9 C)  Resp 14  Ht 5\' 6"  (1.676 m)  Wt 154 lb 6.4 oz (70.035 kg)  BMI 24.93 kg/m2  SpO2 97%     General Appearance:  averagely built and nourished, pleasant. His face looks somewhat puffy         Eyes: No excessive prominence. No swelling of the eyelids  Neck: The thyroid is  barely palpable on the right side   REFLEXES: at biceps are normal, somewhat difficult to elicit .   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 Written instructions given   Andrew Duffy 02/22/2014, 11:11 AM

## 2014-04-21 ENCOUNTER — Other Ambulatory Visit (INDEPENDENT_AMBULATORY_CARE_PROVIDER_SITE_OTHER): Payer: Self-pay

## 2014-04-21 DIAGNOSIS — E890 Postprocedural hypothyroidism: Secondary | ICD-10-CM

## 2014-04-21 LAB — TSH: TSH: 1.81 u[IU]/mL (ref 0.35–4.50)

## 2014-04-28 ENCOUNTER — Ambulatory Visit (INDEPENDENT_AMBULATORY_CARE_PROVIDER_SITE_OTHER): Payer: Self-pay | Admitting: Endocrinology

## 2014-04-28 ENCOUNTER — Encounter: Payer: Self-pay | Admitting: Endocrinology

## 2014-04-28 VITALS — BP 127/62 | HR 55 | Temp 98.0°F | Resp 14 | Ht 66.0 in | Wt 153.2 lb

## 2014-04-28 DIAGNOSIS — E890 Postprocedural hypothyroidism: Secondary | ICD-10-CM

## 2014-04-28 NOTE — Progress Notes (Signed)
Patient ID: Andrew Duffy, male   DOB: 04-26-1986, 28 y.o.   MRN: [id number]                                                                                                              Andrew Duffy 29 y.o.   Reason for Appointment:  Hypothyroidism, followup   History of Present Illness:    Background History:   He was treated for hyperthyroidism with I-131 on 12/23/13. 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  He became hypothyroid in 7/15 and was having symptoms of fatigue and weight gain He was started on levothyroxine 112 mcg He is taking this daily although occasionally may forget to take it in the morning His fatigue is better and his only complaints are his hair breaking and some dryness in his throat He has not taken his metoprolol as directed  Wt Readings from Last 3 Encounters:  04/28/14 153 lb 3.2 oz (69.491 kg)  02/22/14 154 lb 6.4 oz (70.035 kg)  01/11/14 144 lb 12.8 oz (65.681 kg)    Lab Results  Component Value Date   FREET4 0.19* 02/20/2014   FREET4 2.42* 01/06/2014   TSH 1.81 04/21/2014   TSH 8.04* 02/20/2014   TSH <0.008* 11/09/2013       Medication List       This list is accurate as of: 04/28/14  8:34 AM.  Always use your most recent med list.               acetaminophen 500 MG tablet  Commonly known as:  TYLENOL  Take 1,000 mg by mouth every 6 (six) hours as needed for headache.     levothyroxine 112 MCG tablet  Commonly known as:  SYNTHROID, LEVOTHROID  Take 1 tablet (112 mcg total) by mouth daily.     metoprolol tartrate 25 MG tablet  Commonly known as:  LOPRESSOR  Take 1 tablet (25 mg total) by mouth 2 (two) times daily.            Past Medical History  Diagnosis Date  . SVT (supraventricular tachycardia)   . Atrial fibrillation     Following treatment of SVT with adenosine    Past Surgical History  Procedure Laterality Date  . None      No family history on file.  Social  History:  reports that he has been smoking Cigarettes.  He has been smoking about 0.00 packs per day. He does not have any smokeless tobacco history on file. He reports that he does not drink alcohol or use illicit drugs.  Allergies: No Known Allergies  Review of Systems:  Has no  history of high blood pressure.      Had one episode of palpitations for 2 or 3 times along with a little dizziness once         Examination:   BP 127/62  Pulse 55  Temp(Src) 98 F (36.7 C)  Resp 14  Ht  (1.676 m)  Wt 153 lb 3.2 oz (69.491 kg)  BMI 24.74 kg/m2  SpO2 97%  Heart rate regular and 60. Heart sounds normal He looks well Thyroid not palpable Deep tendon reflexes appear to be normal, difficult to elicit especially biceps   No ankle edema     Assessment/Plan:   Post ablative hypothyroidism TSH is not quite normal and he will continue the same dose for 6 months To call if he has any unusual fatigue  He will followup with PCP if he has any further episodes of palpitations  Andrew Duffy 04/28/2014, 8:34 AM

## 2014-06-06 ENCOUNTER — Telehealth: Payer: Self-pay | Admitting: Endocrinology

## 2014-06-06 ENCOUNTER — Other Ambulatory Visit: Payer: Self-pay | Admitting: Endocrinology

## 2014-06-06 NOTE — Telephone Encounter (Signed)
rx sent

## 2014-06-06 NOTE — Telephone Encounter (Signed)
Patient would like refills on his levothyroxine   [facility] [place]   Thank you

## 2014-10-23 ENCOUNTER — Other Ambulatory Visit (INDEPENDENT_AMBULATORY_CARE_PROVIDER_SITE_OTHER): Payer: Self-pay

## 2014-10-23 DIAGNOSIS — E890 Postprocedural hypothyroidism: Secondary | ICD-10-CM

## 2014-10-23 LAB — TSH: TSH: 0.99 u[IU]/mL (ref 0.35–4.50)

## 2014-10-27 ENCOUNTER — Encounter: Payer: Self-pay | Admitting: Endocrinology

## 2014-10-27 ENCOUNTER — Ambulatory Visit (INDEPENDENT_AMBULATORY_CARE_PROVIDER_SITE_OTHER): Payer: Self-pay | Admitting: Endocrinology

## 2014-10-27 VITALS — BP 104/63 | HR 53 | Temp 97.9°F | Resp 14 | Ht 66.0 in | Wt 156.8 lb

## 2014-10-27 DIAGNOSIS — E890 Postprocedural hypothyroidism: Secondary | ICD-10-CM

## 2014-10-27 MED ORDER — LEVOTHYROXINE SODIUM 112 MCG PO TABS: 112.0000 ug | ORAL_TABLET | Freq: Every day | ORAL | Status: DC

## 2014-10-27 NOTE — Patient Instructions (Signed)
Same dose 

## 2014-10-27 NOTE — Progress Notes (Signed)
Patient ID: Andrew Duffy, male   DOB: 10/11/1985, 29 y.o.   MRN: [id number]                                                                                                              Andrew Duffy 29 y.o.   Reason for Appointment:  Hypothyroidism, followup   History of Present Illness:    Background History:   He was treated for hyperthyroidism with I-131 on 12/23/13. 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  He became hypothyroid in 7/15 and was having symptoms of fatigue and weight gain He was started on levothyroxine 112 mcg and has been continued on this dose since then  He is taking this daily although occasionally may forget to take it in the morning Currently has no unusual fatigue, has gained a little weight  Wt Readings from Last 3 Encounters:  10/27/14 156 lb 12.8 oz (71.124 kg)  04/28/14 153 lb 3.2 oz (69.491 kg)  02/22/14 154 lb 6.4 oz (70.035 kg)    Lab Results  Component Value Date   TSH 0.99 10/23/2014   TSH 1.81 04/21/2014   TSH 8.04* 02/20/2014   FREET4 0.19* 02/20/2014   FREET4 2.42* 01/06/2014   FREET4 3.21* 11/09/2013        Medication List       This list is accurate as of: 10/27/14  8:22 AM.  Always use your most recent med list.               acetaminophen 500 MG tablet  Commonly known as:  TYLENOL  Take 1,000 mg by mouth every 6 (six) hours as needed for headache.     levothyroxine 112 MCG tablet  Commonly known as:  SYNTHROID, LEVOTHROID  TAKE ONE TABLET BY MOUTH DAILY            Past Medical History  Diagnosis Date  . SVT (supraventricular tachycardia)   . Atrial fibrillation     Following treatment of SVT with adenosine    Past Surgical History  Procedure Laterality Date  . None      No family history on file.  Social History:  reports that he has been smoking Cigarettes.  He does not have any smokeless tobacco history on file. He reports that he does not drink  alcohol or use illicit drugs.  Allergies: No Known Allergies  Review of Systems:  Has no  history of high blood pressure.   He is again asking about occasional episodes of palpitations      Examination:   BP 104/63 mmHg  Pulse 53  Temp(Src) 97.9 F (36.6 C)  Resp 14  Ht  (1.676 m)  Wt 156 lb 12.8 oz (71.124 kg)  BMI 25.32 kg/m2  SpO2 97%  Heart rate regular and 56. Heart sounds normal He looks well Thyroid not palpable Deep tendon reflexes appear to be normal, difficult to elicit especially biceps     Assessment/Plan:   Post ablative hypothyroidism TSH is consistently quite normal and he will  continue the same dose for 12 months To call if he has any unusual fatigue  He will followup with PCP for evaluation of palpitations  Andrew Duffy 10/27/2014, 8:22 AM

## 2014-12-08 ENCOUNTER — Ambulatory Visit: Payer: Self-pay

## 2015-01-11 ENCOUNTER — Ambulatory Visit: Payer: Self-pay

## 2015-08-02 IMAGING — US US SOFT TISSUE HEAD/NECK
1 series · 14 of 25 positions shown · non-contrast
Comparison: None.

CLINICAL DATA: Hyperthyroid

EXAM:
THYROID ULTRASOUND
TECHNIQUE: Ultrasound examination of the thyroid gland and adjacent soft
tissues was performed.

[Series 1: us soft tissue head/neck · 0.07mm/px · 14 of 46 slices shown]
[im 1/46]
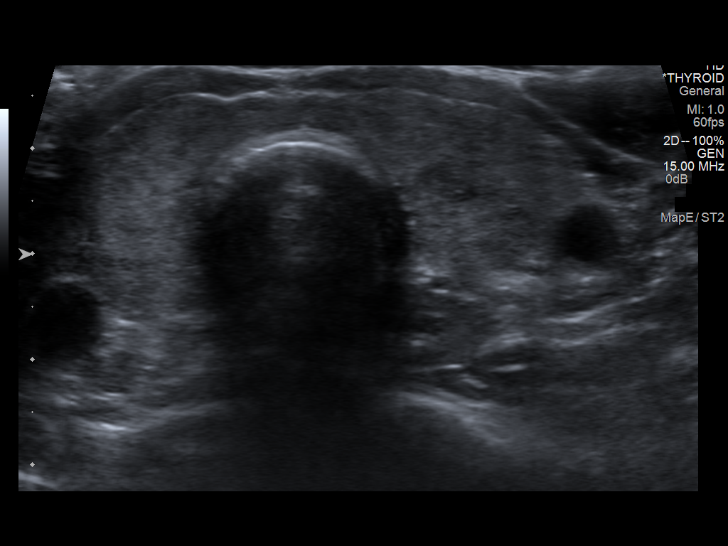
[im 4/46]
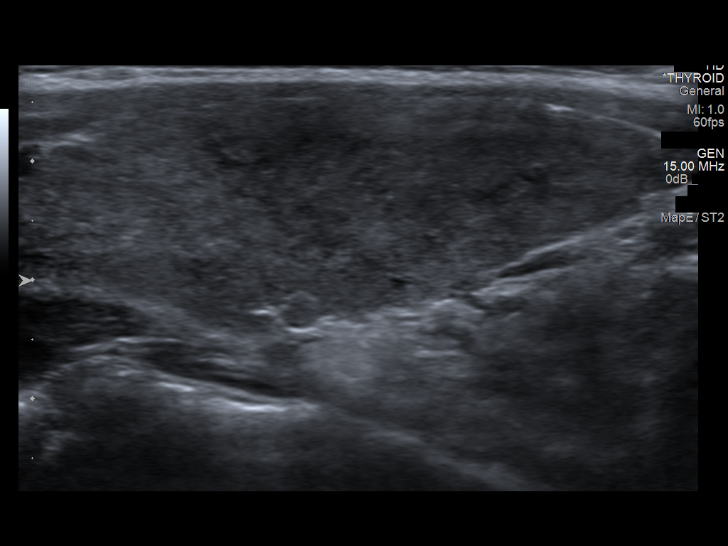
[im 8/46]
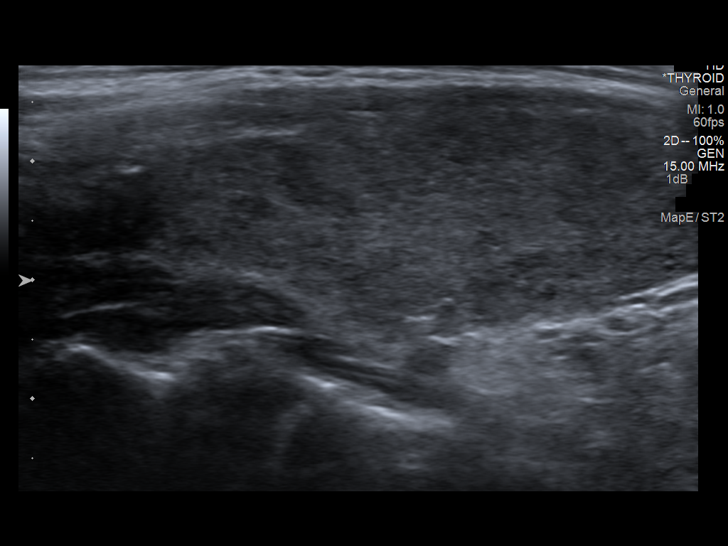
[im 12/46]
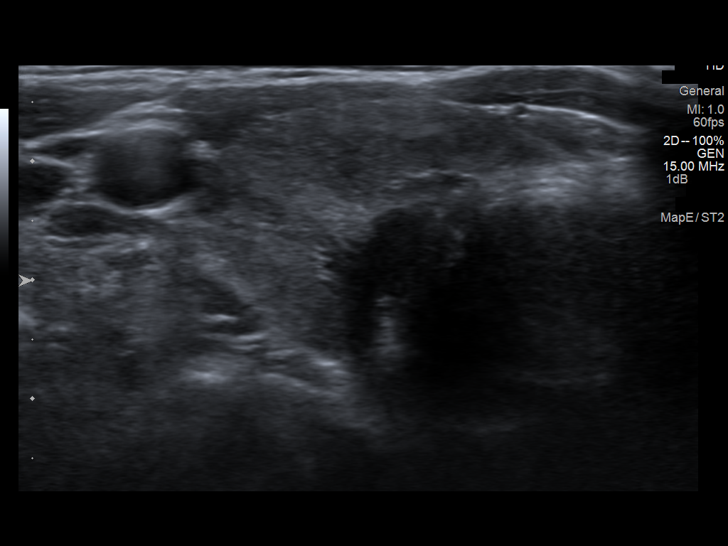
[im 16/46]
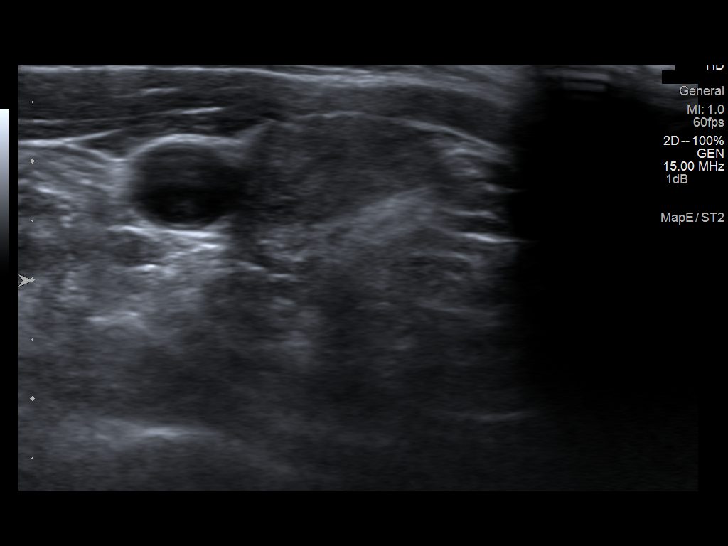
[im 17/46]
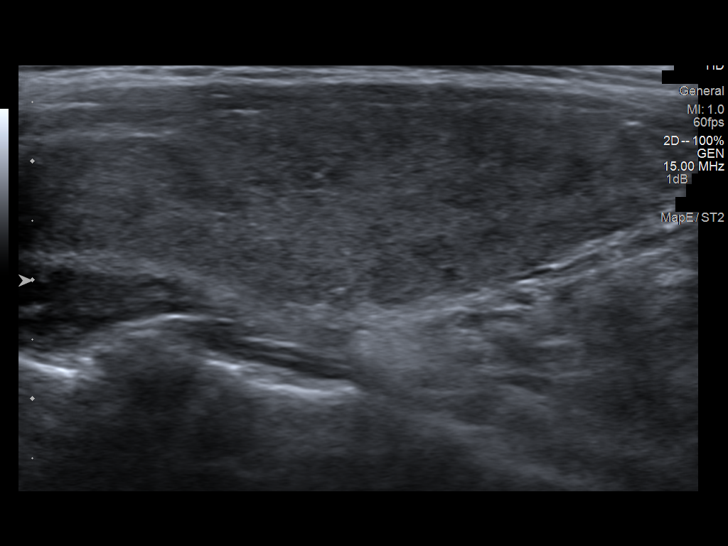
[im 21/46]
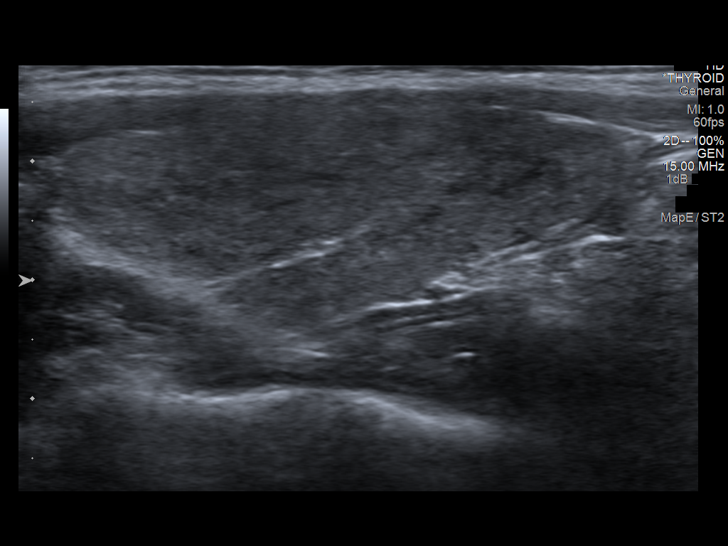
[im 25/46]
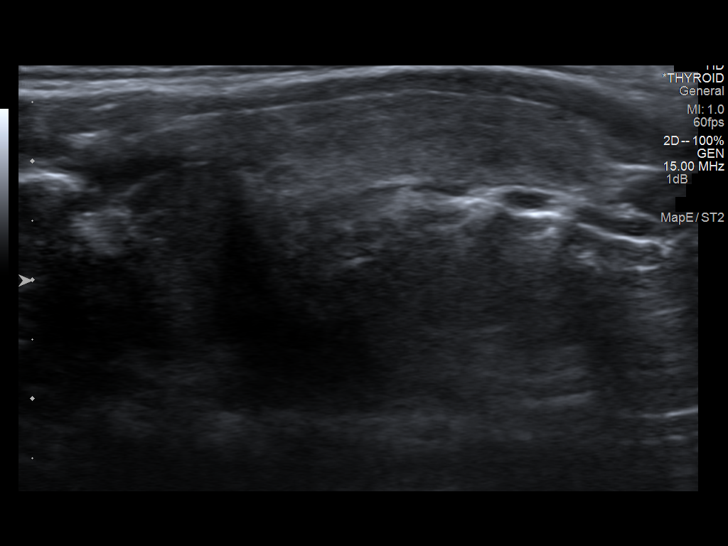
[im 29/46]
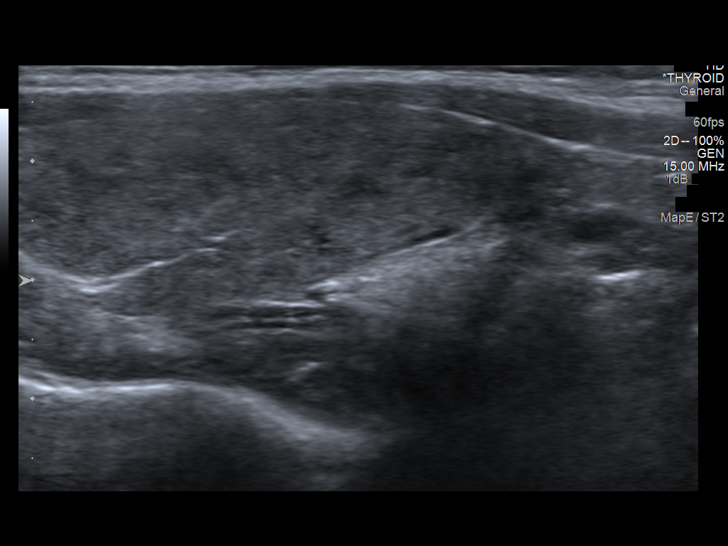
[im 31/46]
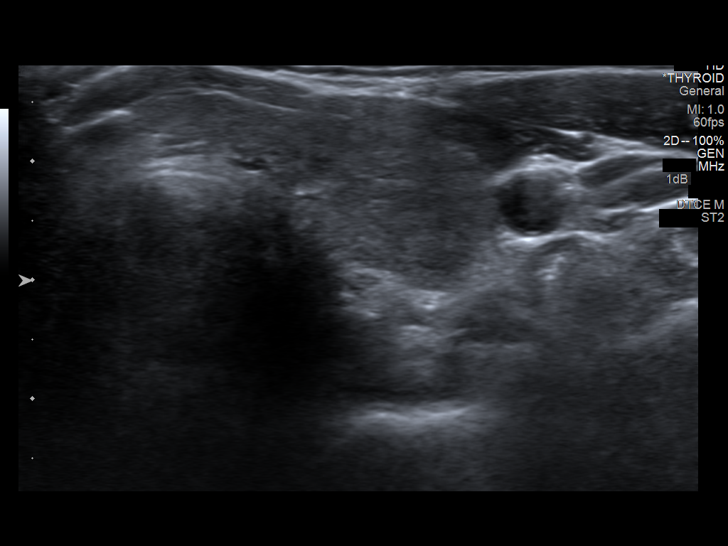
[im 34/46]
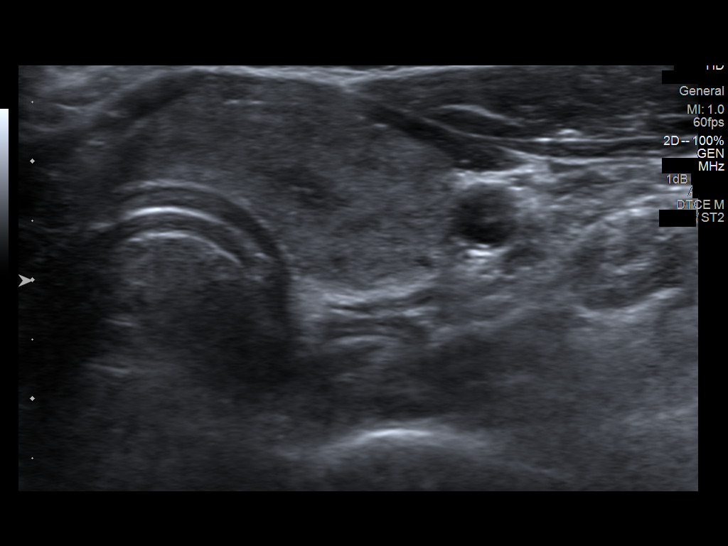
[im 38/46]
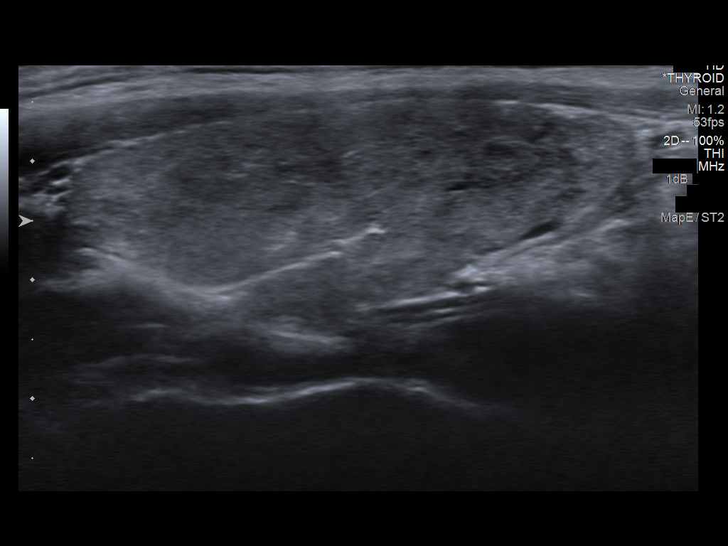
[im 42/46]
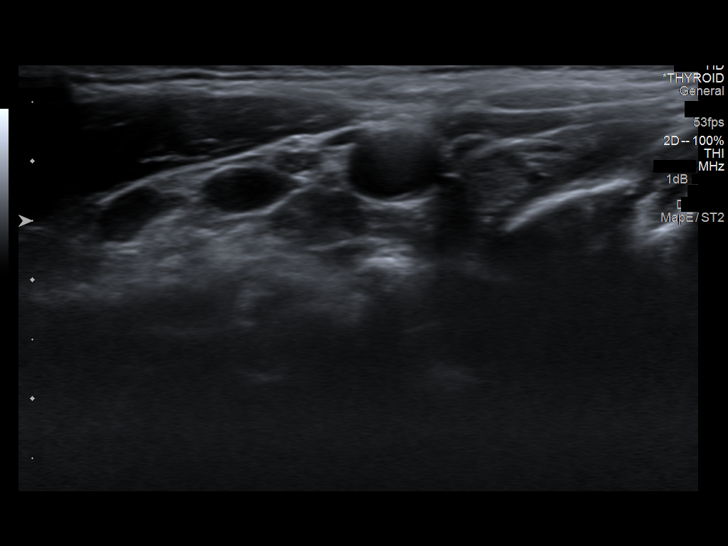
[im 46/46]
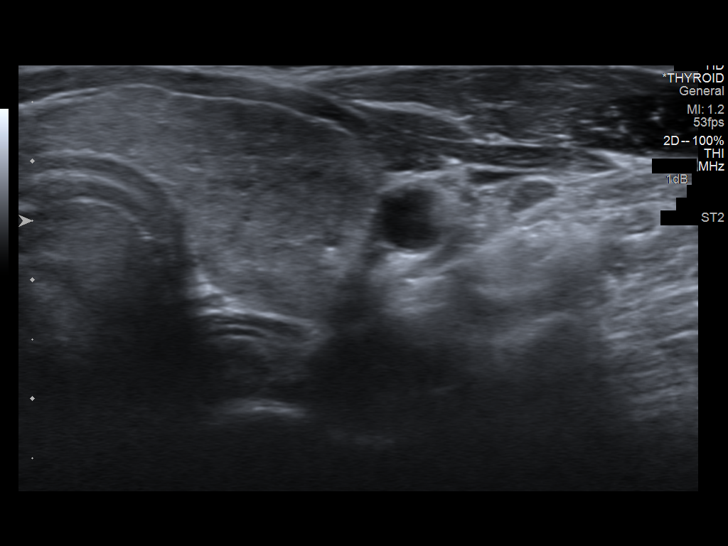

[14 of 25 positions shown; findings below may reference images not displayed]

FINDINGS: There is mild heterogeneity of thyroid parenchymal echotexture.
There is no definitive abnormal vascularity this thyroid parenchyma
(image 3).

Right thyroid lobe

Measurements: Mildly enlarged measuring 5.6 x 1.8 x 2.2 cm.

No discrete nodules identified within the right lobe of the thyroid.

Left thyroid lobe

Measurements: Mildly enlarged measuring 4.9 x 2.1 x 1.7 cm.

No discrete nodules are identified within the left lobe of the
thyroid.

Isthmus

Thickness: Normal in size measuring 0.3 cm in diameter.

No discrete nodules are identified within the thyroid isthmus.

Lymphadenopathy

None visualized.
IMPRESSION: Mildly enlarged and heterogeneous appearing thyroid gland without
discrete nodule.

## 2015-10-26 ENCOUNTER — Other Ambulatory Visit: Payer: Self-pay | Admitting: *Deleted

## 2015-10-26 ENCOUNTER — Telehealth: Payer: Self-pay | Admitting: Endocrinology

## 2015-10-26 MED ORDER — LEVOTHYROXINE SODIUM 112 MCG PO TABS: 112.0000 ug | ORAL_TABLET | Freq: Every day | ORAL | Status: DC

## 2015-10-26 NOTE — Telephone Encounter (Signed)
Patient came into the office to schedule follow up appointment  Please call in Rx   Rx: Levothyroxine   [facility] [place]   Thank you

## 2015-10-26 NOTE — Telephone Encounter (Signed)
rx sent

## 2015-11-01 ENCOUNTER — Ambulatory Visit (INDEPENDENT_AMBULATORY_CARE_PROVIDER_SITE_OTHER): Payer: Self-pay | Admitting: Endocrinology

## 2015-11-01 ENCOUNTER — Encounter: Payer: Self-pay | Admitting: Endocrinology

## 2015-11-01 VITALS — BP 116/68 | HR 87 | Temp 98.7°F | Resp 14 | Ht 66.0 in | Wt 161.2 lb

## 2015-11-01 DIAGNOSIS — E890 Postprocedural hypothyroidism: Secondary | ICD-10-CM

## 2015-11-01 LAB — TSH: TSH: 0.57 u[IU]/mL (ref 0.35–4.50)

## 2015-11-01 NOTE — Progress Notes (Signed)
Patient ID: Andrew Duffy, male   DOB: 1985-12-13, 30 y.o.   MRN: [id number]                                                                                                              Andrew Duffy 30 y.o.   Reason for Appointment:  Hypothyroidism, followup   History of Present Illness:    Background History:   He was treated for hyperthyroidism with I-131 on 12/23/13. 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  He became hypothyroid in 7/15 and was having symptoms of fatigue and weight gain He was started on levothyroxine 112 mcg and has been continued on this dose since then  He is taking this daily now He has gained a little weight gain He says that he feels tired after meals but not otherwise He tends to get cold easily also  Wt Readings from Last 3 Encounters:  11/01/15 161 lb 3.2 oz (73.12 kg)  10/27/14 156 lb 12.8 oz (71.124 kg)  04/28/14 153 lb 3.2 oz (69.491 kg)    Lab Results  Component Value Date   TSH 0.57 11/01/2015   TSH 0.99 10/23/2014   TSH 1.81 04/21/2014   FREET4 0.19* 02/20/2014   FREET4 2.42* 01/06/2014   FREET4 3.21* 11/09/2013        Medication List       This list is accurate as of: 11/01/15  9:52 PM.  Always use your most recent med list.               acetaminophen 500 MG tablet  Commonly known as:  TYLENOL  Take 1,000 mg by mouth every 6 (six) hours as needed for headache.     levothyroxine 112 MCG tablet  Commonly known as:  SYNTHROID, LEVOTHROID  Take 1 tablet (112 mcg total) by mouth daily.     tobramycin 0.3 % ophthalmic solution  Commonly known as:  TOBREX  INT 1 GTT AEY Q 4 H            Past Medical History  Diagnosis Date  . SVT (supraventricular tachycardia) (HCC)   . Atrial fibrillation [facility])     Following treatment of SVT with adenosine    Past Surgical History  Procedure Laterality Date  . None      No family history on file.  Social History:   reports that he has been smoking Cigarettes.  He does not have any smokeless tobacco history on file. He reports that he does not drink alcohol or use illicit drugs.  Allergies: No Known Allergies  Review of Systems:  He is again asking about occasional episodes of palpitations on bending mostly    Examination:   BP 116/68 mmHg  Pulse 87  Temp(Src) 98.7 F (37.1 C)  Resp 14  Ht  (1.676 m)  Wt 161 lb 3.2 oz (73.12 kg)  BMI 26.03 kg/m2  SpO2 97%  He looks well Thyroid not palpable Deep tendon reflexes appear to be normal, difficult to elicit   Assessment/Plan:  Post ablative hypothyroidism TSH is consistently quite normal and he will continue the same dose for 12 months To call if he has any unusual fatigue  He will followup with PCP for evaluation of palpitations  Andrew Duffy 11/01/2015, 9:52 PM

## 2015-11-01 NOTE — Progress Notes (Signed)
Quick Note:  Please let patient know that the lab result is normal and refill Rx for 1 year ______

## 2015-11-02 ENCOUNTER — Other Ambulatory Visit: Payer: Self-pay | Admitting: *Deleted

## 2015-11-02 MED ORDER — LEVOTHYROXINE SODIUM 112 MCG PO TABS: 112.0000 ug | ORAL_TABLET | Freq: Every day | ORAL | Status: DC

## 2016-11-10 ENCOUNTER — Other Ambulatory Visit: Payer: Self-pay | Admitting: Endocrinology

## 2016-11-10 NOTE — Telephone Encounter (Signed)
He may have 30 tablets with note to make appointment for further refills

## 2016-11-10 NOTE — Telephone Encounter (Signed)
Patient must make appointment for any further refills

## 2016-11-10 NOTE — Telephone Encounter (Signed)
Last ov 11/01/15 with no future scheduled ok to refill?

## 2016-12-13 ENCOUNTER — Other Ambulatory Visit: Payer: Self-pay | Admitting: Endocrinology

## 2016-12-15 ENCOUNTER — Other Ambulatory Visit: Payer: Self-pay | Admitting: Endocrinology

## 2016-12-15 ENCOUNTER — Other Ambulatory Visit: Payer: Self-pay

## 2016-12-15 DIAGNOSIS — E890 Postprocedural hypothyroidism: Secondary | ICD-10-CM

## 2016-12-16 ENCOUNTER — Telehealth: Payer: Self-pay | Admitting: Endocrinology

## 2016-12-16 ENCOUNTER — Other Ambulatory Visit (INDEPENDENT_AMBULATORY_CARE_PROVIDER_SITE_OTHER): Payer: Self-pay

## 2016-12-16 DIAGNOSIS — E890 Postprocedural hypothyroidism: Secondary | ICD-10-CM

## 2016-12-16 NOTE — Telephone Encounter (Signed)
We have to do his TSH test when he is taking medication consistently

## 2016-12-16 NOTE — Telephone Encounter (Signed)
Pt has been out of meds for a little while, pt is wondering why were you asking about whether he was taking it?

## 2016-12-17 ENCOUNTER — Ambulatory Visit: Payer: Self-pay | Admitting: Endocrinology

## 2016-12-17 LAB — TSH: TSH: 0.45 u[IU]/mL (ref 0.35–4.50)

## 2016-12-17 NOTE — Telephone Encounter (Signed)
Pt had labs drawn yesterday and will be here today for his appt

## 2016-12-18 ENCOUNTER — Ambulatory Visit (INDEPENDENT_AMBULATORY_CARE_PROVIDER_SITE_OTHER): Payer: Self-pay | Admitting: Endocrinology

## 2016-12-18 ENCOUNTER — Encounter: Payer: Self-pay | Admitting: Endocrinology

## 2016-12-18 VITALS — BP 138/80 | HR 86 | Ht 66.0 in | Wt 159.2 lb

## 2016-12-18 DIAGNOSIS — E890 Postprocedural hypothyroidism: Secondary | ICD-10-CM

## 2016-12-18 MED ORDER — LEVOTHYROXINE SODIUM 100 MCG PO TABS: 100.0000 ug | ORAL_TABLET | Freq: Every day | ORAL | 3 refills | Status: DC

## 2016-12-18 NOTE — Progress Notes (Signed)
Patient ID: Andrew Duffy, male   DOB: 04-29-1986, 31 y.o.   MRN: [id number]                                                                                                              Andrew Duffy 31 y.o.   Reason for Appointment:  Hypothyroidism, followup   History of Present Illness:    Background History:   He was treated for hyperthyroidism with I-131 on 12/23/13. 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  He became hypothyroid in 7/15 and was having symptoms of fatigue and weight gain He was started on levothyroxine 112 mcg and has been continued on this dose since then  He is taking this daily before breakfast regularly However he ran out of the medication last weekend and was feeling a little weak Otherwise he feels fairly good with his energy level No significant change in weight  He does have sporadic episodes of palpitations with rapid heartbeat, was apparently advised to hold to monitor but this was too expensive and these episodes only last about 30 seconds now  His TSH is still low normal, done on Tuesday after he had been off his medication for 2 days on the weekend  Wt Readings from Last 3 Encounters:  12/18/16 159 lb 3.2 oz (72.2 kg)  11/01/15 161 lb 3.2 oz (73.1 kg)  10/27/14 156 lb 12.8 oz (71.1 kg)    Lab Results  Component Value Date   TSH 0.45 12/16/2016   TSH 0.57 11/01/2015   TSH 0.99 10/23/2014   FREET4 0.19 (L) 02/20/2014   FREET4 2.42 (H) 01/06/2014   FREET4 3.21 (H) 11/09/2013      Allergies as of 12/18/2016   No Known Allergies     Medication List       Accurate as of 12/18/16  4:15 PM. Always use your most recent med list.          acetaminophen 500 MG tablet Commonly known as:  TYLENOL Take 1,000 mg by mouth every 6 (six) hours as needed for headache.   EYE DROPS IRRITATION RELIEF OP Apply to eye. Use as needed in both eyes   levothyroxine 112 MCG tablet Commonly known  as:  SYNTHROID, LEVOTHROID TAKE ONE TABLET BY MOUTH EVERY DAY           Past Medical History:  Diagnosis Date  . Atrial fibrillation (HCC)    Following treatment of SVT with adenosine  . SVT (supraventricular tachycardia) (HCC)     Past Surgical History:  Procedure Laterality Date  . None      No family history on file.  Social History:  reports that he has never smoked. He has never used smokeless tobacco. He reports that he does not drink alcohol or use drugs.  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

## 2016-12-18 NOTE — Patient Instructions (Addendum)
Synthroid 112  dose take 6 1/2 pills per week  100ug will be daily   La dosis Synthroid 112 toma 6 1/2 pastillas por semana  100ug ser a diario

## 2017-05-29 ENCOUNTER — Emergency Department (HOSPITAL_COMMUNITY): Payer: Worker's Compensation

## 2017-05-29 ENCOUNTER — Encounter (HOSPITAL_COMMUNITY): Payer: Self-pay

## 2017-05-29 ENCOUNTER — Emergency Department (HOSPITAL_COMMUNITY)
Admission: EM | Admit: 2017-05-29 | Discharge: 2017-05-29 | Disposition: A | Discharge status: Home / Self Care | Payer: Worker's Compensation | Attending: Emergency Medicine | Admitting: Emergency Medicine

## 2017-05-29 DIAGNOSIS — S0993XA Unspecified injury of face, initial encounter: Secondary | ICD-10-CM

## 2017-05-29 DIAGNOSIS — W228XXA Striking against or struck by other objects, initial encounter: Secondary | ICD-10-CM | POA: Insufficient documentation

## 2017-05-29 DIAGNOSIS — Y999 Unspecified external cause status: Secondary | ICD-10-CM | POA: Diagnosis not present

## 2017-05-29 DIAGNOSIS — S0511XA Contusion of eyeball and orbital tissues, right eye, initial encounter: Secondary | ICD-10-CM

## 2017-05-29 DIAGNOSIS — Y929 Unspecified place or not applicable: Secondary | ICD-10-CM | POA: Insufficient documentation

## 2017-05-29 DIAGNOSIS — Y93H9 Activity, other involving exterior property and land maintenance, building and construction: Secondary | ICD-10-CM | POA: Diagnosis not present

## 2017-05-29 DIAGNOSIS — Z23 Encounter for immunization: Secondary | ICD-10-CM | POA: Insufficient documentation

## 2017-05-29 DIAGNOSIS — Z79899 Other long term (current) drug therapy: Secondary | ICD-10-CM | POA: Insufficient documentation

## 2017-05-29 DIAGNOSIS — E039 Hypothyroidism, unspecified: Secondary | ICD-10-CM | POA: Insufficient documentation

## 2017-05-29 DIAGNOSIS — S0081XA Abrasion of other part of head, initial encounter: Secondary | ICD-10-CM | POA: Insufficient documentation

## 2017-05-29 DIAGNOSIS — S0990XA Unspecified injury of head, initial encounter: Secondary | ICD-10-CM | POA: Diagnosis present

## 2017-05-29 LAB — BASIC METABOLIC PANEL
Anion gap: 7 (ref 5–15)
BUN: 11 mg/dL (ref 6–20)
CHLORIDE: 102 mmol/L (ref 101–111)
CO2: 27 mmol/L (ref 22–32)
CREATININE: 0.74 mg/dL (ref 0.61–1.24)
Calcium: 9.3 mg/dL (ref 8.9–10.3)
GFR calc Af Amer: >60 mL/min (ref >60)
GLUCOSE: 108 mg/dL — AB (ref 65–99)
Potassium: 3.9 mmol/L (ref 3.5–5.1)
Sodium: 136 mmol/L (ref 135–145)

## 2017-05-29 LAB — CBC WITH DIFFERENTIAL/PLATELET
BASOS ABS: 0 10*3/uL (ref 0.0–0.1)
Basophils Relative: 1 %
EOS PCT: 2 %
Eosinophils Absolute: 0.1 10*3/uL (ref 0.0–0.7)
HCT: 44.5 % (ref 39.0–52.0)
Hemoglobin: 15.4 g/dL (ref 13.0–17.0)
LYMPHS PCT: 35 %
Lymphs Abs: 1.5 10*3/uL (ref 0.7–4.0)
MCH: 29.7 pg (ref 26.0–34.0)
MCHC: 34.6 g/dL (ref 30.0–36.0)
MCV: 85.7 fL (ref 78.0–100.0)
Monocytes Absolute: 0.3 10*3/uL (ref 0.1–1.0)
Monocytes Relative: 7 %
NEUTROS ABS: 2.4 10*3/uL (ref 1.7–7.7)
Neutrophils Relative %: 55 %
PLATELETS: 302 10*3/uL (ref 150–400)
RBC: 5.19 MIL/uL (ref 4.22–5.81)
RDW: 13 % (ref 11.5–15.5)
WBC: 4.3 10*3/uL (ref 4.0–10.5)

## 2017-05-29 MED ORDER — TETANUS-DIPHTH-ACELL PERTUSSIS 5-2.5-18.5 LF-MCG/0.5 IM SUSP
0.5000 mL | Freq: Once | INTRAMUSCULAR | Status: AC
  Administered 2017-05-29: 0.5 mL via INTRAMUSCULAR
  Filled 2017-05-29: qty 0.5

## 2017-05-29 MED ORDER — FENTANYL CITRATE (PF) 100 MCG/2ML IJ SOLN
100.0000 ug | Freq: Once | INTRAMUSCULAR | Status: AC
  Administered 2017-05-29: 100 ug via INTRAVENOUS
  Filled 2017-05-29: qty 2

## 2017-05-29 MED ORDER — KETOROLAC TROMETHAMINE 0.5 % OP SOLN: OPHTHALMIC | 0 refills | Status: DC

## 2017-05-29 MED ORDER — HYDROCODONE-ACETAMINOPHEN 5-325 MG PO TABS: 1.0000 | ORAL_TABLET | ORAL | 0 refills | Status: DC | PRN

## 2017-05-29 NOTE — Discharge Instructions (Signed)
There were no serious injuries found today.  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  It is unlikely that it will give you long-term problems.  For the facial wounds, clean with soap and water daily to improve the chances of healing.  For the eye discomfort, use the eyedrops for discomfort, and the pain medicine as needed.  Follow-up with the eye doctor if you are having problems.

## 2017-05-29 NOTE — ED Triage Notes (Signed)
Patient was sawing a tree and a stick went in his right eye.

## 2017-05-29 NOTE — ED Provider Notes (Signed)
[facility]-EMERGENCY DEPT Provider Note   CSN: [id number] Arrival date & time: 05/29/17  0908     History   Chief Complaint Chief Complaint  Patient presents with  . eye injury    HPI Andrew Duffy is a 31 y.o. male.  He was working, cutting trees when a branch accidentally struck him in the right eye.  He denies other injuries.  He presents by private vehicle for evaluation.  Unknown prior tetanus status.  No recent illnesses.  There are no other known modifying factors.  HPI  Past Medical History:  Diagnosis Date  . Atrial fibrillation (HCC)    Following treatment of SVT with adenosine  . SVT (supraventricular tachycardia) [facility])     Patient Active Problem List   Diagnosis Date Noted  . 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 Hypokalemia 11/08/2013  . A-fib (HCC) 11/08/2013  . SVT (supraventricular tachycardia) (HCC) 11/08/2013  . Elevated troponin 11/08/2013    Past Surgical History:  Procedure Laterality Date  . None    . THYROIDECTOMY         Home Medications    Prior to Admission medications   Medication Sig Start Date End Date Taking? Authorizing Provider  acetaminophen (TYLENOL) 500 MG tablet Take 1,000 mg by mouth every 6 (six) hours as needed for headache.   Yes [provider]  levothyroxine (SYNTHROID, LEVOTHROID) 100 MCG tablet Take 1 tablet (100 mcg total) by mouth daily. 12/18/16  Yes Reather Littler, MD  HYDROcodone-acetaminophen (NORCO) 5-325 MG tablet Take 1 tablet by mouth every 4 (four) hours as needed. 05/29/17   Mancel Bale, MD  ketorolac Berkley Harvey) 0.5 % ophthalmic solution 1 drop right eye every 6 hours as needed for pain 05/29/17   Mancel Bale, MD    Family History History reviewed. No pertinent family history.  Social History Social History  Substance Use Topics  . Smoking status: Never Smoker  . Smokeless tobacco: Never Used     Comment: Few cigarettes a week.    . Alcohol use No     Comment: occ      Allergies   Patient has no known allergies.   Review of Systems Review of Systems  All other systems reviewed and are negative.    Physical Exam Updated Vital Signs BP 123/81 (BP Location: Left Arm)   Pulse 60   Temp 98.8 F (37.1 C) (Oral)   Resp 15   Ht  (1.702 m)   Wt 73.9 kg (163 lb)   SpO2 97%   BMI 25.53 kg/m   Physical Exam  Constitutional: He is oriented to person, place, and time. He appears well-developed and well-nourished. No distress.  HENT:  Head: Normocephalic and atraumatic.  Right Ear: External ear normal.  Left Ear: External ear normal.  Eyes: EOM are normal.  Right eye injury-visible laceration medial to the iris.  Iris defect present.  Globe is tender, but full.  EOMI.  Mild conjunctival contusion, without active bleeding.  No evidence for hyphema.  Pupils constricted bilaterally.  Representative image, below.  Neck: Normal range of motion and phonation normal. Neck supple.  Cardiovascular: Normal rate, regular rhythm and normal heart sounds.   Pulmonary/Chest: Effort normal and breath sounds normal. He exhibits no bony tenderness.  Musculoskeletal: Normal range of motion.  Neurological: He is alert and oriented to person, place, and time. No cranial nerve deficit or sensory deficit. He exhibits normal muscle tone. Coordination normal.  Skin: Skin is warm, dry and intact.  Psychiatric: He has  a normal mood and affect. His behavior is normal. Judgment and thought content normal.  Nursing note and vitals reviewed.           ED Treatments / Results  Labs (all labs ordered are listed, but only abnormal results are displayed) Labs Reviewed  BASIC METABOLIC PANEL - Abnormal; Notable for the following:       Result Value   Glucose, Bld 108 (*)    All other components within normal limits  CBC WITH DIFFERENTIAL/PLATELET    EKG  EKG Interpretation None       Radiology Ct Orbits Wo Contrast  Result Date: 05/29/2017 CLINICAL  DATA:  Periorbital laceration after trauma today. EXAM: CT ORBITS WITHOUT CONTRAST TECHNIQUE: Multidetector CT images were obtained using the standard protocol without intravenous contrast. COMPARISON:  None. FINDINGS: Orbits: No traumatic or inflammatory finding. Globes, optic nerves, orbital fat, extraocular muscles, vascular structures, and lacrimal glands are normal. Visualized sinuses: Clear. Soft tissues: Negative. Limited intracranial: No significant or unexpected finding. IMPRESSION: No definite abnormality seen. Electronically Signed   By: Lupita Raider, M.D.   On: 05/29/2017 10:27    Procedures Procedures (including critical care time)  Medications Ordered in ED Medications  Tdap (BOOSTRIX) injection 0.5 mL (0.5 mLs Intramuscular Given 05/29/17 0958)  fentaNYL (SUBLIMAZE) injection 100 mcg (100 mcg Intravenous Given 05/29/17 1122)     Initial Impression / Assessment and Plan / ED Course  I have reviewed the triage vital signs and the nursing notes.  Pertinent labs & imaging results that were available during my care of the patient were reviewed by me and considered in my medical decision making (see chart for details).      Patient Vitals for the past 24 hrs:  BP Temp Temp src Pulse Resp SpO2 Height Weight  05/29/17 1127 123/81 - - 60 15 97 % - -  05/29/17 0931 - - - - - -  (1.702 m) 73.9 kg (163 lb)  05/29/17 0917 104/73 98.8 F (37.1 C) Oral 67 18 98 % - -   10: 32-requested called back from ophthalmology.  11: 45-case discussed with ophthalmology, before and after second and third images were taken.  Dr. Arrie Aran believes that the patient has a preceding pterygium, to explain the right eye abnormality.  There is no significant uptake, fluorescein dye seen.  11:48 AM Reevaluation with update and discussion. After initial assessment and treatment, an updated evaluation reveals he is more comfortable at this time.  Findings discussed with the patient and all  questions were answered. Hibah Odonnell L     Final Clinical Impressions(s) / ED Diagnoses   Final diagnoses:  Facial injury, initial encounter  Contusion, eye, right, initial encounter  Abrasion of face, initial encounter   Facial injury and contusion right eye, without serious injuries.  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  Suspect contusion right eye causing his eye discomfort.  There is no evidence for anterior eye injury or significant corneal injury.  Doubt fracture.  Nursing Notes Reviewed/ Care Coordinated Applicable Imaging Reviewed Interpretation of Laboratory Data incorporated into ED treatment  The patient appears reasonably screened and/or stabilized for discharge and I doubt any other medical condition or other [facility] requiring further screening, evaluation, or treatment in the ED at this time prior to discharge.  Plan: Home Medications-OTC analgesia, as needed; Home Treatments-rest, fluids, wound care; return here if the recommended treatment, does not improve the symptoms; Recommended follow up-PCP, as needed.  Ophthalmology if needed for ongoing  eye pain    New Prescriptions New Prescriptions   HYDROCODONE-ACETAMINOPHEN (NORCO) 5-325 MG TABLET    Take 1 tablet by mouth every 4 (four) hours as needed.   KETOROLAC (ACULAR) 0.5 % OPHTHALMIC SOLUTION    1 drop right eye every 6 hours as needed for pain     Mancel Bale, MD 05/29/17 1203

## 2017-05-29 NOTE — ED Notes (Signed)
Patient transported to CT 

## 2017-05-29 NOTE — ED Notes (Signed)
Trimming trees and tree branch hit him in right eye

## 2017-05-29 NOTE — ED Notes (Signed)
MD at bedside. 

## 2017-06-16 ENCOUNTER — Other Ambulatory Visit (INDEPENDENT_AMBULATORY_CARE_PROVIDER_SITE_OTHER): Payer: Self-pay

## 2017-06-16 DIAGNOSIS — E890 Postprocedural hypothyroidism: Secondary | ICD-10-CM

## 2017-06-16 LAB — TSH: TSH: 5.38 u[IU]/mL — AB (ref 0.35–4.50)

## 2017-06-19 ENCOUNTER — Ambulatory Visit (INDEPENDENT_AMBULATORY_CARE_PROVIDER_SITE_OTHER): Payer: Self-pay | Admitting: Endocrinology

## 2017-06-19 ENCOUNTER — Encounter: Payer: Self-pay | Admitting: Endocrinology

## 2017-06-19 VITALS — BP 110/72 | HR 85 | Ht 66.0 in | Wt 160.6 lb

## 2017-06-19 DIAGNOSIS — E890 Postprocedural hypothyroidism: Secondary | ICD-10-CM

## 2017-06-19 NOTE — Patient Instructions (Signed)
Take extra 1/2 tab on Saturdays of the 100ug dose

## 2017-06-19 NOTE — Progress Notes (Signed)
Patient ID: Andrew Duffy, male   DOB: 1985/08/21, 31 y.o.   MRN: [id number]                                                                                                              Andrew Duffy 31 y.o.   Reason for Appointment:  Hypothyroidism, followup   History of Present Illness:    Background History:   He was treated for hyperthyroidism with I-131 on 12/23/13. 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  He became hypothyroid in 7/15 and was having symptoms of fatigue and weight gain He was started on levothyroxine 112 mcg   In 12/2016 he was having some symptoms of palpitations episodically, also was irregular with the thyroid medication couple of days before his lab work and the TSH was only 0.45 Because of this his dose was reduced down to 100 g  He says he feels fairly good with his energy level No significant change in weight No cold intolerance  His TSH is now higher than normal at 5.4 He does however think he has taken his levothyroxine daily recently   Wt Readings from Last 3 Encounters:  06/19/17 160 lb 9.6 oz (72.8 kg)  05/29/17 163 lb (73.9 kg)  12/18/16 159 lb 3.2 oz (72.2 kg)    Lab Results  Component Value Date   TSH 5.38 (H) 06/16/2017   TSH 0.45 12/16/2016   TSH 0.57 11/01/2015   FREET4 0.19 (L) 02/20/2014   FREET4 2.42 (H) 01/06/2014   FREET4 3.21 (H) 11/09/2013      Allergies as of 06/19/2017   No Known Allergies     Medication List       Accurate as of 06/19/17  4:22 PM. Always use your most recent med list.          acetaminophen 500 MG tablet Commonly known as:  TYLENOL Take 1,000 mg by mouth every 6 (six) hours as needed for headache.   levothyroxine 100 MCG tablet Commonly known as:  SYNTHROID, LEVOTHROID Take 1 tablet (100 mcg total) by mouth daily.           Past Medical History:  Diagnosis Date  . Atrial fibrillation (HCC)    Following treatment of SVT with  adenosine  . SVT (supraventricular tachycardia) (HCC)     Past Surgical History:  Procedure Laterality Date  . None    . THYROIDECTOMY      No family history on file.  Social History:  reports that he has never smoked. He has never used smokeless tobacco. He reports that he does not drink alcohol or use drugs.  Allergies: No Known Allergies  Review of Systems:  Palpitations: was recommended her to monitor by cardiologist because of history of SVT but he has not done this or followed up   Examination:   BP 110/72   Pulse 85   Ht 5\' 6"  (1.676 m)   Wt 160 lb 9.6 oz (72.8 kg)   SpO2 96%   BMI 25.92 kg/m  He looks well Heart sounds regular Deep tendon reflexes appear to be normal No swelling of eyes or face   Assessment/Plan:   Post ablative hypothyroidism TSH is above normal even No on the last visit he was having a low TSH and had left off his medication couple of days prior to his lab appointment  He has been quite regular with the medication and not taking any vitamins with this  He will now go to the 112 g dose again, however with his current supply he can take an extra  half a pill weekly    Patient Instructions  Take extra 1/2 tab on Saturdays of the 100ug dose    Makyia Erxleben 06/19/2017, 4:22 PM

## 2017-06-20 ENCOUNTER — Emergency Department (HOSPITAL_COMMUNITY): Admission: EM | Admit: 2017-06-20 | Discharge: 2017-06-20 | Discharge status: Against Medical Advice | Payer: Self-pay

## 2017-06-21 MED ORDER — LEVOTHYROXINE SODIUM 112 MCG PO TABS: 112.0000 ug | ORAL_TABLET | Freq: Every day | ORAL | 3 refills | Status: DC

## 2017-10-16 ENCOUNTER — Other Ambulatory Visit (INDEPENDENT_AMBULATORY_CARE_PROVIDER_SITE_OTHER): Payer: Self-pay

## 2017-10-16 DIAGNOSIS — E890 Postprocedural hypothyroidism: Secondary | ICD-10-CM

## 2017-10-16 LAB — TSH: TSH: 0.87 u[IU]/mL (ref 0.35–4.50)

## 2017-10-19 ENCOUNTER — Encounter: Payer: Self-pay | Admitting: Endocrinology

## 2017-10-19 ENCOUNTER — Ambulatory Visit (INDEPENDENT_AMBULATORY_CARE_PROVIDER_SITE_OTHER): Payer: Self-pay | Admitting: Endocrinology

## 2017-10-19 VITALS — BP 128/70 | HR 83 | Ht 66.0 in | Wt 159.0 lb

## 2017-10-19 DIAGNOSIS — E890 Postprocedural hypothyroidism: Secondary | ICD-10-CM

## 2017-10-19 MED ORDER — LEVOTHYROXINE SODIUM 112 MCG PO TABS: 112.0000 ug | ORAL_TABLET | Freq: Every day | ORAL | 3 refills | Status: DC

## 2017-10-19 NOTE — Progress Notes (Signed)
Patient ID: Andrew Duffy, male   DOB: 03-30-1986, 32 y.o.   MRN: [id number]                                                                                                              Lancer Thurner 32 y.o.   Reason for Appointment:  Hypothyroidism, followup   History of Present Illness:    Background History:   He was treated for hyperthyroidism with I-131 on 12/23/13. 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  He became hypothyroid in 7/15 and was having symptoms of fatigue and weight gain He was started on levothyroxine 112 mcg   In 12/2016 he was having some symptoms of palpitations episodically, also was irregular with the thyroid medication couple of days before his lab work and the TSH was only 0.45 Because of this his dose was reduced down to 100 g  Subsequently no change in compliance his TSH was 5.4 in October 2018 He is now taking 112 mcg daily He takes this an hour before breakfast daily  He says he feels fairly good with his energy level Weight had gone up slightly and is back down No consistent heat or cold intolerance  His TSH is now back to normal   Wt Readings from Last 3 Encounters:  10/19/17 159 lb (72.1 kg)  06/19/17 160 lb 9.6 oz (72.8 kg)  05/29/17 163 lb (73.9 kg)    Lab Results  Component Value Date   TSH 0.87 10/16/2017   TSH 5.38 (H) 06/16/2017   TSH 0.45 12/16/2016   FREET4 0.19 (L) 02/20/2014   FREET4 2.42 (H) 01/06/2014   FREET4 3.21 (H) 11/09/2013      Allergies as of 10/19/2017   No Known Allergies     Medication List        Accurate as of 10/19/17  3:53 PM. Always use your most recent med list.          acetaminophen 500 MG tablet Commonly known as:  TYLENOL Take 1,000 mg by mouth every 6 (six) hours as needed for headache.   levothyroxine 112 MCG tablet Commonly known as:  SYNTHROID, LEVOTHROID Take 1 tablet (112 mcg total) daily by mouth.           Past Medical  History:  Diagnosis Date  . Atrial fibrillation (HCC)    Following treatment of SVT with adenosine  . SVT (supraventricular tachycardia) (HCC)     Past Surgical History:  Procedure Laterality Date  . None    . THYROIDECTOMY      History reviewed. No pertinent family history.  Social History:  reports that  has never smoked. he has never used smokeless tobacco. He reports that he does not drink alcohol or use drugs.  Allergies: No Known Allergies  Review of Systems:  Palpitations: was recommended to monitor by cardiologist because of history of SVT but he has not followed up   Examination:   BP 128/70 (BP Location: Left Arm, Patient Position: Sitting, Cuff Size: Normal)   Pulse  83   Ht 5\' 6"  (1.676 m)   Wt 159 lb (72.1 kg)   SpO2 96%   BMI 25.66 kg/m   He looks well Heart sounds regular, no abnormal sounds or clicks    Assessment/Plan:   Post ablative hypothyroidism TSH is back to normal with 112 mcg of levothyroxine and he is taking this regularly  He will now come back annually for follow-up   There are no Patient Instructions on file for this visit.   Reather LittlerAjay Cherril Hett 10/19/2017, 3:53 PM

## 2017-11-18 ENCOUNTER — Other Ambulatory Visit: Payer: Self-pay | Admitting: Endocrinology

## 2017-11-18 ENCOUNTER — Telehealth: Payer: Self-pay | Admitting: Endocrinology

## 2017-11-18 NOTE — Telephone Encounter (Signed)
[facility] PH# [phone] called re: patient is out of Levothyroxine 112 mcg. Please send RX for above medication asap to [facility] fax# [phone]-this medication was requested by the pharmacy last month after patient came in for visit-pharmacy did not get a response-now patient is out of thyroid med.

## 2017-11-18 NOTE — Telephone Encounter (Signed)
This has been done.

## 2017-11-19 ENCOUNTER — Other Ambulatory Visit: Payer: Self-pay

## 2018-02-23 ENCOUNTER — Other Ambulatory Visit: Payer: Self-pay | Admitting: Endocrinology

## 2018-06-24 ENCOUNTER — Other Ambulatory Visit: Payer: Self-pay | Admitting: Endocrinology

## 2018-10-18 ENCOUNTER — Other Ambulatory Visit (INDEPENDENT_AMBULATORY_CARE_PROVIDER_SITE_OTHER): Payer: Self-pay

## 2018-10-18 DIAGNOSIS — E890 Postprocedural hypothyroidism: Secondary | ICD-10-CM

## 2018-10-18 LAB — TSH: TSH: 1.21 u[IU]/mL (ref 0.35–4.50)

## 2018-10-20 ENCOUNTER — Encounter: Payer: Self-pay | Admitting: Endocrinology

## 2018-10-20 ENCOUNTER — Ambulatory Visit (INDEPENDENT_AMBULATORY_CARE_PROVIDER_SITE_OTHER): Payer: Worker's Compensation | Admitting: Endocrinology

## 2018-10-20 VITALS — BP 110/72 | HR 85 | Ht 66.0 in | Wt 166.0 lb

## 2018-10-20 DIAGNOSIS — E890 Postprocedural hypothyroidism: Secondary | ICD-10-CM

## 2018-10-20 MED ORDER — LEVOTHYROXINE SODIUM 112 MCG PO TABS: 112.0000 ug | ORAL_TABLET | Freq: Every day | ORAL | 3 refills | Status: DC

## 2018-10-20 NOTE — Progress Notes (Signed)
Patient ID: Andrew Duffy, male   DOB: 1986/03/08, 33 y.o.   MRN: [id number]                                                                                                              Andrew Duffy 33 y.o.   Reason for Appointment:  Hypothyroidism, followup   History of Present Illness:    Background History:   He was treated for hyperthyroidism with I-131 on 12/23/13. 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  He became hypothyroid in 7/15 and was having symptoms of fatigue and weight gain He was started on levothyroxine 112 mcg   In 12/2016 he was having some symptoms of palpitations episodically, also was irregular with the thyroid medication couple of days before his lab work and the TSH was only 0.45 Because of this his dose was reduced down to 100 g  Subsequently no change in compliance his TSH was 5.4 in October 2018 He is since then taking 112 mcg daily He takes this an hour before breakfast daily  He says he feels fairly good with his energy level Weight had gone up 7 pounds Only rarely will have some palpitations as before No heat or cold intolerance  His TSH is now consistently back to normal   Wt Readings from Last 3 Encounters:  10/20/18 166 lb (75.3 kg)  10/19/17 159 lb (72.1 kg)  06/19/17 160 lb 9.6 oz (72.8 kg)    Lab Results  Component Value Date   TSH 1.21 10/18/2018   TSH 0.87 10/16/2017   TSH 5.38 (H) 06/16/2017   FREET4 0.19 (L) 02/20/2014   FREET4 2.42 (H) 01/06/2014   FREET4 3.21 (H) 11/09/2013      Allergies as of 10/20/2018   No Known Allergies     Medication List       Accurate as of October 20, 2018  3:53 PM. Always use your most recent med list.        acetaminophen 500 MG tablet Commonly known as:  TYLENOL Take 1,000 mg by mouth every 6 (six) hours as needed for headache.   levothyroxine 112 MCG tablet Commonly known as:  SYNTHROID, LEVOTHROID TAKE ONE TABLET BY MOUTH  DAILY           Past Medical History:  Diagnosis Date  . Atrial fibrillation (HCC)    Following treatment of SVT with adenosine  . SVT (supraventricular tachycardia) (HCC)     Past Surgical History:  Procedure Laterality Date  . None    . THYROIDECTOMY      History reviewed. No pertinent family history.  Social History:  reports that he has never smoked. He has never used smokeless tobacco. He reports that he does not drink alcohol or use drugs.  Allergies: No Known Allergies  Review of Systems:  Palpitations: was recommended to monitor by cardiologist because of history of SVT but he probably did not go back because of financial reasons    Examination:   BP 110/72 (BP Location: Left Arm, Patient  Position: Sitting, Cuff Size: Normal)   Pulse 85   Ht 5\' 6"  (1.676 m)   Wt 166 lb (75.3 kg)   SpO2 98%   BMI 26.79 kg/m   He looks well Heart sounds regular Biceps reflexes difficult to elicit    Assessment/Plan:   Post ablative hypothyroidism TSH is again normal with 112 mcg of levothyroxine and he is taking this regularly  He will now come back annually for follow-up   There are no Patient Instructions on file for this visit.   Reather Littler 10/20/2018, 3:53 PM

## 2019-10-19 ENCOUNTER — Other Ambulatory Visit (INDEPENDENT_AMBULATORY_CARE_PROVIDER_SITE_OTHER): Payer: Self-pay

## 2019-10-19 ENCOUNTER — Other Ambulatory Visit: Payer: Self-pay

## 2019-10-19 DIAGNOSIS — E890 Postprocedural hypothyroidism: Secondary | ICD-10-CM

## 2019-10-20 ENCOUNTER — Encounter: Payer: Self-pay | Admitting: Endocrinology

## 2019-10-20 ENCOUNTER — Ambulatory Visit (INDEPENDENT_AMBULATORY_CARE_PROVIDER_SITE_OTHER): Payer: Self-pay | Admitting: Endocrinology

## 2019-10-20 VITALS — BP 110/60 | HR 89 | Ht 66.0 in | Wt 170.2 lb

## 2019-10-20 DIAGNOSIS — E890 Postprocedural hypothyroidism: Secondary | ICD-10-CM

## 2019-10-20 LAB — T4, FREE: Free T4: 1.24 ng/dL (ref 0.60–1.60)

## 2019-10-20 LAB — TSH: TSH: 0.5 u[IU]/mL (ref 0.35–4.50)

## 2019-10-20 NOTE — Progress Notes (Signed)
Patient ID: Andrew Duffy, male   DOB: 02/04/1986, 34 y.o.   MRN: [id number]                                                                                                              Andrew Duffy 34 y.o.   Reason for Appointment:  Hypothyroidism, followup   History of Present Illness:    Background History:   He was treated for hyperthyroidism with I-131 on 12/23/13. 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  He became hypothyroid in 7/15 and was having symptoms of fatigue and weight gain He was started on levothyroxine 112 mcg   In 12/2016 he was having some symptoms of palpitations episodically, also was irregular with the thyroid medication couple of days before his lab work and the TSH was only 0.45 Because of this his dose was reduced down to 100 g  He is since 10/18 taking 112 mcg daily since his TSH was 5.4 He takes this an hour before breakfast daily  Recently has been feeling quite good with no fatigue His only question is that he sometimes gets anxious and will eat more because of this His weight has gone up another 4 pounds Did not feel any unusual shakiness or palpitations  His TSH is consistently back to normal although low normal at 0.5 compared to last year   Wt Readings from Last 3 Encounters:  10/20/19 170 lb 3.2 oz (77.2 kg)  10/20/18 166 lb (75.3 kg)  10/19/17 159 lb (72.1 kg)    Lab Results  Component Value Date   TSH 0.50 10/19/2019   TSH 1.21 10/18/2018   TSH 0.87 10/16/2017   FREET4 1.24 10/19/2019   FREET4 0.19 (L) 02/20/2014   FREET4 2.42 (H) 01/06/2014      Allergies as of 10/20/2019   No Known Allergies     Medication List       Accurate as of October 20, 2019 11:59 PM. If you have any questions, ask your nurse or doctor.        acetaminophen 500 MG tablet Commonly known as: TYLENOL Take 1,000 mg by mouth every 6 (six) hours as needed for headache.   levothyroxine 112 MCG  tablet Commonly known as: SYNTHROID Take 1 tablet (112 mcg total) by mouth daily.           Past Medical History:  Diagnosis Date  . Atrial fibrillation ([place])    Following treatment of SVT with adenosine  . SVT (supraventricular tachycardia) (HCC)     Past Surgical History:  Procedure Laterality Date  . None    . THYROIDECTOMY      History reviewed. No pertinent family history.  Social History:  reports that he has never smoked. He has never used smokeless tobacco. He reports that he does not drink alcohol or use drugs.  Allergies: No Known Allergies  Review of Systems:  Does not have a PCP    Examination:   BP 110/60 (BP Location: Left Arm, Patient Position: Sitting, Cuff Size: Normal)  Pulse 89   Ht 5\' 6"  (1.676 m)   Wt 170 lb 3.2 oz (77.2 kg)   SpO2 97%   BMI 27.47 kg/m   He looks well Thyroid not palpable Biceps reflexes difficult to elicit No tremor   Assessment/Plan:   Post ablative hypothyroidism TSH is normal with 112 mcg of levothyroxine although at 0.5 it may be lower than before He is also having some nonspecific anxiety and gaining weight from stress eating We will try taking 6-1/2 tablets a week instead of 7 to see if this will reduce some of his anxiety Otherwise he can come back in a year unless he has new fatigue  I recommended establishing with community wellness clinic for PCP  Patient Instructions  Take 1/2 tab Sundays and 1 on other days  Tome 1/2 11-30-1975 domingos y 1 [employer]     [address] 10/21/2019, 8:30 AM

## 2019-10-20 NOTE — Patient Instructions (Signed)
Take 1/2 tab Sundays and 1 on other days  Tome 1/2 [employer] y 1 [address]

## 2019-11-15 ENCOUNTER — Other Ambulatory Visit: Payer: Self-pay | Admitting: Endocrinology

## 2020-02-27 ENCOUNTER — Other Ambulatory Visit: Payer: Self-pay | Admitting: Endocrinology

## 2020-06-05 ENCOUNTER — Other Ambulatory Visit: Payer: Self-pay | Admitting: Endocrinology

## 2020-10-19 ENCOUNTER — Other Ambulatory Visit: Payer: Self-pay

## 2020-10-19 ENCOUNTER — Other Ambulatory Visit (INDEPENDENT_AMBULATORY_CARE_PROVIDER_SITE_OTHER): Payer: Self-pay

## 2020-10-19 DIAGNOSIS — E890 Postprocedural hypothyroidism: Secondary | ICD-10-CM

## 2020-10-19 LAB — TSH: TSH: 1.6 u[IU]/mL (ref 0.35–4.50)

## 2020-10-19 LAB — T4, FREE: Free T4: 1.21 ng/dL (ref 0.60–1.60)

## 2020-10-22 ENCOUNTER — Other Ambulatory Visit: Payer: Self-pay

## 2020-10-25 ENCOUNTER — Other Ambulatory Visit: Payer: Self-pay

## 2020-10-25 ENCOUNTER — Ambulatory Visit (INDEPENDENT_AMBULATORY_CARE_PROVIDER_SITE_OTHER): Payer: Self-pay | Admitting: Endocrinology

## 2020-10-25 ENCOUNTER — Encounter: Payer: Self-pay | Admitting: Endocrinology

## 2020-10-25 VITALS — BP 112/78 | HR 68 | Resp 18 | Ht 66.0 in | Wt 164.2 lb

## 2020-10-25 DIAGNOSIS — E890 Postprocedural hypothyroidism: Secondary | ICD-10-CM

## 2020-10-25 NOTE — Progress Notes (Signed)
Patient ID: Andrew Duffy, male   DOB: 1986/04/28, 35 y.o.   MRN: [id number]                                                                                                              Andrew Duffy 35 y.o.   Reason for Appointment:  Hypothyroidism, followup   History of Present Illness:    Background History:   He was treated for hyperthyroidism with I-131 on 12/23/13. 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  He became hypothyroid in 7/15 and was having symptoms of fatigue and weight gain He was started on levothyroxine 112 mcg   In 12/2016 he was having some symptoms of palpitations episodically, also was irregular with the thyroid medication couple of days before his lab work and the TSH was only 0.45 Because of this his dose was reduced down to 100 g  Since 10/18 has been on a prescription of 112 mcg, dose increased when his TSH was 5.4 Subsequently with low normal TSH he has been taking 6-1/2 tablets a week  He takes this an hour before breakfast daily  Does not complain of any fatigue On his previous visit he was having more anxiety and weight gain from stress eating This is happening less often Only rare palpitations Weight has come down   His TSH is 1.6 compared to 0.5   Wt Readings from Last 3 Encounters:  10/25/20 164 lb 3.2 oz (74.5 kg)  10/20/19 170 lb 3.2 oz (77.2 kg)  10/20/18 166 lb (75.3 kg)    Lab Results  Component Value Date   TSH 1.60 10/19/2020   TSH 0.50 10/19/2019   TSH 1.21 10/18/2018   FREET4 1.21 10/19/2020   FREET4 1.24 10/19/2019   FREET4 0.19 (L) 02/20/2014      Allergies as of 10/25/2020   No Known Allergies     Medication List       Accurate as of October 25, 2020  3:48 PM. If you have any questions, ask your nurse or doctor.        acetaminophen 500 MG tablet Commonly known as: TYLENOL Take 1,000 mg by mouth every 6 (six) hours as needed for headache.   Euthyrox  112 MCG tablet Generic drug: levothyroxine Take 1 tablet by mouth once daily           Past Medical History:  Diagnosis Date  . Atrial fibrillation (HCC)    Following treatment of SVT with adenosine  . SVT (supraventricular tachycardia) (HCC)     Past Surgical History:  Procedure Laterality Date  . None    . THYROIDECTOMY      No family history on file.  Social History:  reports that he has never smoked. He has never used smokeless tobacco. He reports that he does not drink alcohol and does not use drugs.  Allergies: No Known Allergies  Review of Systems:  Still has not established with a PCP    Examination:   BP 112/78 (BP Location: Left Arm)  Pulse 68   Resp 18   Ht 5\' 6"  (1.676 m)   Wt 164 lb 3.2 oz (74.5 kg)   SpO2 98%   BMI 26.50 kg/m      Assessment/Plan:   Post ablative hypothyroidism TSH is normal with 112 mcg of levothyroxine, 6-1/2 days a week Subjectively doing well also He will continue taking 6-1/2 tablets a week as before Follow-up in 1 year unless having any new symptoms  Given him contact information for community wellness clinic to establish with a PCP   There are no Patient Instructions on file for this visit.   10/25/2020, 3:48 PM

## 2020-12-18 ENCOUNTER — Other Ambulatory Visit: Payer: Self-pay | Admitting: Endocrinology

## 2021-07-02 ENCOUNTER — Other Ambulatory Visit: Payer: Self-pay | Admitting: Endocrinology

## 2021-10-22 ENCOUNTER — Other Ambulatory Visit: Payer: Self-pay | Admitting: Endocrinology

## 2021-10-22 DIAGNOSIS — E890 Postprocedural hypothyroidism: Secondary | ICD-10-CM

## 2021-10-29 ENCOUNTER — Other Ambulatory Visit: Payer: Self-pay | Admitting: Endocrinology

## 2021-10-29 ENCOUNTER — Other Ambulatory Visit (INDEPENDENT_AMBULATORY_CARE_PROVIDER_SITE_OTHER): Payer: Self-pay

## 2021-10-29 ENCOUNTER — Other Ambulatory Visit: Payer: Self-pay

## 2021-10-29 DIAGNOSIS — E890 Postprocedural hypothyroidism: Secondary | ICD-10-CM

## 2021-10-30 LAB — TSH: TSH: 2.5 u[IU]/mL (ref 0.35–5.50)

## 2021-10-31 ENCOUNTER — Ambulatory Visit (INDEPENDENT_AMBULATORY_CARE_PROVIDER_SITE_OTHER): Payer: Self-pay | Admitting: Endocrinology

## 2021-10-31 ENCOUNTER — Encounter: Payer: Self-pay | Admitting: Endocrinology

## 2021-10-31 ENCOUNTER — Other Ambulatory Visit: Payer: Self-pay

## 2021-10-31 VITALS — BP 114/80 | HR 81 | Ht 66.0 in | Wt 167.2 lb

## 2021-10-31 DIAGNOSIS — E890 Postprocedural hypothyroidism: Secondary | ICD-10-CM

## 2021-10-31 NOTE — Progress Notes (Signed)
Patient ID: Andrew Duffy, male   DOB: 03-Mar-1986, 36 y.o.   MRN: [id number] ? ?                                                                                              ? ?             ?Andrew Duffy 36 y.o. ? ? ?Reason for Appointment:  Hypothyroidism, followup ? ? History of Present Illness:  ? ? Background History:  ? ?He was treated for hyperthyroidism with I-131 on 12/23/13. 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 ? ?He became hypothyroid in 7/15 and was having symptoms of fatigue and weight gain ?He was started on levothyroxine 112 mcg ? ? In 12/2016 he was having some symptoms of palpitations episodically, also was irregular with the thyroid medication couple of days before his lab work and the TSH was only 0.45 ?Because of this his dose was reduced down to 100 ?g ? ?Since 10/18 has been on a prescription of 112 mcg, dose adjusted slightly since then ?He has been taking 6-1/2 tablets a week as before ? ?He takes this an hour before breakfast daily and has been quite regular with the supplement ? ?Recently has been feeling good most of the time ?Only rarely has had an episode of palpitations ?Weight is about the same ? ?Thyroid levels have been consistently normal recently ? ? ?Wt Readings from Last 3 Encounters:  ?10/31/21 167 lb 3.2 oz (75.8 kg)  ?10/25/20 164 lb 3.2 oz (74.5 kg)  ?10/20/19 170 lb 3.2 oz (77.2 kg)  ? ? ?Lab Results  ?Component Value Date  ? TSH 2.50 10/29/2021  ? TSH 1.60 10/19/2020  ? TSH 0.50 10/19/2019  ? FREET4 1.21 10/19/2020  ? FREET4 1.24 10/19/2019  ? FREET4 0.19 (L) 02/20/2014  ? ? ? ? ?Allergies as of 10/31/2021   ?No Known Allergies ?  ? ?  ?Medication List  ?  ? ?  ? Accurate as of October 31, 2021  3:51 PM. If you have any questions, ask your nurse or doctor.  ?  ?  ? ?  ? ?acetaminophen 500 MG tablet ?Commonly known as: TYLENOL ?Take 1,000 mg by mouth every 6 (six) hours as needed for headache. ?  ?levothyroxine 112 MCG  tablet ?Commonly known as: SYNTHROID ?Take 1 tablet by mouth once daily ?  ? ?  ?     ? ?Past Medical History:  ?Diagnosis Date  ? Atrial fibrillation (HCC)   ? Following treatment of SVT with adenosine  ? SVT (supraventricular tachycardia) (HCC)   ? ? ?Past Surgical History:  ?Procedure Laterality Date  ? None    ? THYROIDECTOMY    ? ? ?Family History  ?Problem Relation Age of Onset  ? Thyroid disease Other   ? Cancer Other   ? ? ?Social History:  reports that he has never smoked. He has never used smokeless tobacco. He reports that he does not drink alcohol and does not use drugs. ? ?Allergies: No Known Allergies ? ?Review of Systems: ? ? ?Has  not established with  a PCP ? ? ? Examination: ?  ?BP 114/80   Pulse 81   Ht 5\' 6"  (1.676 m)   Wt 167 lb 3.2 oz (75.8 kg)   SpO2 98%   BMI 26.99 kg/m?  ? ? ? ? Assessment/Plan: ? ? Post ablative hypothyroidism: ? ?TSH is again normal with 112 mcg of levothyroxine, 6-1/2 days a week ? ?He is regular with supplement and he feels good ? ?He will continue taking 6-1/2 tablets a week as before ?Follow-up in 1 year unless having any new symptoms ? ? ? ?There are no Patient Instructions on file for this visit. ? ? ? ?10/31/2021, 3:51 PM  ? ? ? ? ?

## 2022-01-30 ENCOUNTER — Other Ambulatory Visit: Payer: Self-pay | Admitting: Endocrinology

## 2022-01-30 DIAGNOSIS — E890 Postprocedural hypothyroidism: Secondary | ICD-10-CM

## 2022-04-23 ENCOUNTER — Other Ambulatory Visit: Payer: Self-pay | Admitting: Endocrinology

## 2022-04-23 DIAGNOSIS — E890 Postprocedural hypothyroidism: Secondary | ICD-10-CM

## 2022-07-18 ENCOUNTER — Other Ambulatory Visit: Payer: Self-pay | Admitting: Endocrinology

## 2022-07-18 DIAGNOSIS — E890 Postprocedural hypothyroidism: Secondary | ICD-10-CM

## 2022-11-04 ENCOUNTER — Other Ambulatory Visit (INDEPENDENT_AMBULATORY_CARE_PROVIDER_SITE_OTHER): Payer: Self-pay

## 2022-11-04 DIAGNOSIS — E890 Postprocedural hypothyroidism: Secondary | ICD-10-CM

## 2022-11-05 LAB — TSH: TSH: 1.01 u[IU]/mL (ref 0.35–5.50)

## 2022-11-06 ENCOUNTER — Ambulatory Visit: Payer: Self-pay | Admitting: Endocrinology

## 2022-11-10 ENCOUNTER — Encounter: Payer: Self-pay | Admitting: Endocrinology

## 2022-11-10 ENCOUNTER — Ambulatory Visit (INDEPENDENT_AMBULATORY_CARE_PROVIDER_SITE_OTHER): Payer: Self-pay | Admitting: Endocrinology

## 2022-11-10 VITALS — BP 120/74 | Ht 66.0 in | Wt 160.0 lb

## 2022-11-10 DIAGNOSIS — E890 Postprocedural hypothyroidism: Secondary | ICD-10-CM

## 2022-11-10 NOTE — Progress Notes (Unsigned)
Patient ID: Andrew Duffy, male   DOB: 1986-08-11, 37 y.o.   MRN: [id number]                                                                                                              Andrew Duffy 37 y.o.   Reason for Appointment:  Hypothyroidism, followup   History of Present Illness:    Background History:   He was treated for hyperthyroidism with I-131 on 12/23/13. 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  He became hypothyroid in 7/15 and was having symptoms of fatigue and weight gain He was started on levothyroxine 112 mcg   In 12/2016 he was having some symptoms of palpitations episodically, also was irregular with the thyroid medication couple of days before his lab work and the TSH was only 0.45 Because of this his dose was reduced down to 100 g  Since 10/18 has been on a prescription of 112 mcg, dose adjusted slightly since then He has been taking 6-1/2 tablets a week as before  He takes this an hour before breakfast daily and has been quite regular with the supplement  Recently has been feeling good most of the time Only rarely has had an episode of palpitations Weight is about the same  Thyroid levels have been consistently normal recently   Wt Readings from Last 3 Encounters:  11/10/22 160 lb (72.6 kg)  10/31/21 167 lb 3.2 oz (75.8 kg)  10/25/20 164 lb 3.2 oz (74.5 kg)    Lab Results  Component Value Date   TSH 1.01 11/04/2022   TSH 2.50 10/29/2021   TSH 1.60 10/19/2020   FREET4 1.21 10/19/2020   FREET4 1.24 10/19/2019   FREET4 0.19 (L) 02/20/2014      Allergies as of 11/10/2022   No Known Allergies      Medication List        Accurate as of November 10, 2022  4:18 PM. If you have any questions, ask your nurse or doctor.          acetaminophen 500 MG tablet Commonly known as: TYLENOL Take 1,000 mg by mouth every 6 (six) hours as needed for headache.   levothyroxine 112 MCG  tablet Commonly known as: SYNTHROID Take 1 tablet by mouth once daily            Past Medical History:  Diagnosis Date   Atrial fibrillation ([place])    Following treatment of SVT with adenosine   SVT (supraventricular tachycardia)     Past Surgical History:  Procedure Laterality Date   None     THYROIDECTOMY      Family History  Problem Relation Age of Onset   Thyroid disease Other    Cancer Other     Social History:  reports that he has never smoked. He has never used smokeless tobacco. He reports that he does not drink alcohol and does not use drugs.  Allergies: No Known Allergies  Review of Systems:   Has  not established with a PCP  Examination:   BP 120/74   Ht 5\' 6"  (1.676 m)   Wt 160 lb (72.6 kg)   BMI 25.82 kg/m      Assessment/Plan:   Post ablative hypothyroidism:  TSH is again normal with 112 mcg of levothyroxine, 6-1/2 days a week  He is regular with supplement and he feels good  He will continue taking 6-1/2 tablets a week as before Follow-up in 1 year unless having any new symptoms    There are no Patient Instructions on file for this visit.   Elayne Snare 11/10/2022, 4:18 PM

## 2023-01-16 ENCOUNTER — Other Ambulatory Visit: Payer: Self-pay | Admitting: Endocrinology

## 2023-01-16 DIAGNOSIS — E890 Postprocedural hypothyroidism: Secondary | ICD-10-CM

## 2023-04-15 ENCOUNTER — Other Ambulatory Visit: Payer: Self-pay | Admitting: Endocrinology

## 2023-04-15 DIAGNOSIS — E890 Postprocedural hypothyroidism: Secondary | ICD-10-CM

## 2023-07-20 ENCOUNTER — Other Ambulatory Visit: Payer: Self-pay

## 2023-09-11 ENCOUNTER — Other Ambulatory Visit: Payer: Self-pay

## 2023-09-22 ENCOUNTER — Telehealth: Payer: Self-pay | Admitting: General Practice

## 2023-09-22 NOTE — Telephone Encounter (Signed)
 Patient called in requesting refill on levothyroxine  (SYNTHROID ) 112 MCG tablet. Patient is former patient of Dr Von and was last seen here 11/10/22. He said he is out of his medication. I did ask him if he would like to schedule an appointment with one of the other providers now that Dr Von is no longer here, He said that he already has an appointment elsewhere next month with a different provider at a different office. He asked if we could send refill to [facility] - [place] ([place]), [place] - [address]

## 2023-09-23 ENCOUNTER — Other Ambulatory Visit: Payer: Self-pay | Admitting: *Deleted

## 2023-09-23 DIAGNOSIS — E890 Postprocedural hypothyroidism: Secondary | ICD-10-CM

## 2023-09-23 MED ORDER — LEVOTHYROXINE SODIUM 112 MCG PO TABS: 112.0000 ug | ORAL_TABLET | Freq: Every day | ORAL | 0 refills | Status: DC

## 2023-09-23 NOTE — Telephone Encounter (Signed)
 Levothyroxine  112 MCG  #30--to [facility] at [facility]

## 2023-09-23 NOTE — Telephone Encounter (Signed)
 Patient is now scheduled with Dr Aretha Kubas - please send 30 day RX of Levothyroxine  112 MCG to [facility] at [facility] until patient is seen. Has taken his last pill this morning

## 2023-09-24 ENCOUNTER — Ambulatory Visit (INDEPENDENT_AMBULATORY_CARE_PROVIDER_SITE_OTHER): Payer: Self-pay | Admitting: Endocrinology

## 2023-09-24 ENCOUNTER — Encounter: Payer: Self-pay | Admitting: Endocrinology

## 2023-09-24 ENCOUNTER — Other Ambulatory Visit: Payer: Self-pay

## 2023-09-24 VITALS — BP 116/68 | HR 64 | Ht 66.0 in | Wt 169.0 lb

## 2023-09-24 DIAGNOSIS — E890 Postprocedural hypothyroidism: Secondary | ICD-10-CM

## 2023-09-24 LAB — T4, FREE: Free T4: 1.3 ng/dL (ref 0.8–1.8)

## 2023-09-24 LAB — TSH: TSH: 1.42 m[IU]/L (ref 0.40–4.50)

## 2023-09-24 NOTE — Progress Notes (Addendum)
 Outpatient Endocrinology Note Faven Watterson, MD   Patient's Name: Andrew Duffy    DOB: 1986-02-10    MRN: [id number]  REASON OF VISIT: Follow-up for hypothyroidism  PCP: Patient, No Pcp Per  HISTORY OF PRESENT ILLNESS:   Genevieve Ritzel is a 38 y.o. old male with past medical history as listed below is presented for a follow up for hypothyroidism.   Pertinent Thyroid  History: Patient was previously seen by Dr. Von and was last time seen in March 2024.  Patient has postablative hypothyroidism on levothyroxine .  Background History:   He was treated for hyperthyroidism with I-131 on 12/23/13. 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 He became hypothyroid in 7/15 and was having symptoms of fatigue and weight gain. He was started on levothyroxine  112 mcg    In 12/2016 he was having some symptoms of palpitations episodically, also was irregular with the thyroid  medication couple of days before his lab work and the TSH was only 0.45. Because of this his dose was reduced down to 100 g   Since 10/18 has been on a prescription of 112 mcg, he has been taking 6-1/2 tablets a week as directed consistently.    Interval history Patient has been taking levothyroxine , reports compliance and taking in the morning before breakfast.  He had his last medication yesterday morning.  He needs refill.  He has mild tiredness occasionally however denies any cold intolerance.  Bowel movement change.  No other hypo or hyperthyroid symptoms.  No other complaints today.  Video Spanish language interpretation used.  REVIEW OF SYSTEMS:  As per history of present illness.   PAST MEDICAL HISTORY: Past Medical History:  Diagnosis Date   Atrial fibrillation (HCC)    Following treatment of SVT with adenosine    SVT (supraventricular tachycardia) (HCC)     PAST SURGICAL HISTORY: Past Surgical History:  Procedure Laterality Date   None     THYROIDECTOMY       ALLERGIES: No Known Allergies  FAMILY HISTORY:  Family History  Problem Relation Age of Onset   Thyroid  disease Other    Cancer Other     SOCIAL HISTORY: Social History   Socioeconomic History   Marital status: Married    Spouse name: Not on file   Number of children: Not on file   Years of education: Not on file   Highest education level: Not on file  Occupational History   Not on file  Tobacco Use   Smoking status: Never   Smokeless tobacco: Never   Tobacco comments:    Few cigarettes a week.    Vaping Use   Vaping status: Never Used  Substance and Sexual Activity   Alcohol use: No    Comment: occ   Drug use: No   Sexual activity: Not on file  Other Topics Concern   Not on file  Social History Narrative   Not on file   Social Drivers of Health   Financial Resource Strain: Not on file  Food Insecurity: Not on file  Transportation Needs: Not on file  Physical Activity: Not on file  Stress: Not on file  Social Connections: Not on file    MEDICATIONS:  Current Outpatient Medications  Medication Sig Dispense Refill   acetaminophen  (TYLENOL ) 500 MG tablet Take 1,000 mg by mouth every 6 (six) hours as needed for headache.     levothyroxine  (SYNTHROID ) 112 MCG tablet Take 1 tablet (112 mcg total) by mouth daily. 90 tablet 4  No current facility-administered medications for this visit.    PHYSICAL EXAM: Vitals:   09/24/23 0907  BP: 116/68  Pulse: 64  SpO2: 97%  Weight: 169 lb (76.7 kg)  Height: 5' 6 (1.676 m)   Body mass index is 27.28 kg/m.  Wt Readings from Last 3 Encounters:  09/24/23 169 lb (76.7 kg)  11/10/22 160 lb (72.6 kg)  10/31/21 167 lb 3.2 oz (75.8 kg)    General: Well developed, well nourished male in no apparent distress.  HEENT: AT/[place], no external lesions. Hearing intact to the spoken word Eyes: EOMI. No stare, proptosis or lid lag. Conjunctiva clear and no icterus. Neck: Trachea midline, neck supple without appreciable  thyromegaly or lymphadenopathy and no palpable thyroid  nodules Lungs: Clear to auscultation, no wheeze. Respirations not labored Heart: S1S2, Regular in rate and rhythm.  Abdomen: Soft, non tender, non distended Neurologic: Alert, oriented, normal speech, deep tendon biceps reflexes normal,  no gross focal neurological deficit Extremities: No pedal pitting edema, no tremors of outstretched hands Skin: Warm, color good.  Psychiatric: Does not appear depressed or anxious  PERTINENT HISTORIC LABORATORY AND IMAGING STUDIES:  All pertinent laboratory results were reviewed. Please see HPI also for further details.   TSH  Date Value Ref Range Status  09/24/2023 1.42 0.40 - 4.50 mIU/L Final  11/04/2022 1.01 0.35 - 5.50 uIU/mL Final  10/29/2021 2.50 0.35 - 5.50 uIU/mL Final   T3, Total  Date Value Ref Range Status  11/09/2013 235.4 (H) 80.0 - 204.0 ng/dl Final    Comment:    Performed at [facility]     ASSESSMENT / PLAN  1. Hypothyroidism, postradioiodine therapy    -Patient has postablative hypothyroidism since 2015.  He has been taking levothyroxine  112 mcg 6 and half tablets a week.  Plan: -Check thyroid  function test today and adjust the dose of levothyroxine  as needed. -He needs refill. -Annual endocrinology follow-up.   We discussed the medical need for compliance with levothyroxine  therapy, that it is a hormone necessary for life, and that serious consequences may result from noncompliance. Discussed the proper method of levothyroxine  administration: take on an empty stomach in the morning, with water, waiting thirty to sixty minutes before taking any other beverages or food. Also reviewed the need to take calcium or iron supplements or multivitamin (that may contain iron or calcium) at least 4 hours after levothyroxine  administration.   Shoji was seen today for follow-up.  Diagnoses and all orders for this visit:  Hypothyroidism, postradioiodine therapy -      T4, free -     TSH -     levothyroxine  (SYNTHROID ) 112 MCG tablet; Take 1 tablet (112 mcg total) by mouth daily.    Labs reviewed normal thyroid  function test, continue current dose of levothyroxine .  Medication refilled.   Latest Reference Range & Units 09/24/23 09:39  TSH 0.40 - 4.50 mIU/L 1.42  T4,Free(Direct) 0.8 - 1.8 ng/dL 1.3    DISPOSITION Follow up in clinic in 12 months suggested.  All questions answered and patient verbalized understanding of the plan.  Valina Maes, MD [facility] [address] [place], [address] [place], [place] [address] Phone # [phone]  At least part of this note was generated using voice recognition software. Inadvertent word errors may have occurred, which were not recognized during the proofreading process.

## 2023-09-25 MED ORDER — LEVOTHYROXINE SODIUM 112 MCG PO TABS: 112.0000 ug | ORAL_TABLET | Freq: Every day | ORAL | 4 refills | Status: AC

## 2023-09-25 NOTE — Addendum Note (Signed)
 Addended by: Taziyah Iannuzzi, [place] on: 09/25/2023 08:02 AM   Modules accepted: Orders

## 2024-07-03 ENCOUNTER — Encounter (HOSPITAL_COMMUNITY): Payer: Self-pay | Admitting: Emergency Medicine

## 2024-07-03 ENCOUNTER — Ambulatory Visit (HOSPITAL_COMMUNITY)
Admission: EM | Admit: 2024-07-03 | Discharge: 2024-07-03 | Disposition: A | Discharge status: Home / Self Care | Payer: Self-pay | Attending: Emergency Medicine | Admitting: Emergency Medicine

## 2024-07-03 ENCOUNTER — Other Ambulatory Visit: Payer: Self-pay

## 2024-07-03 ENCOUNTER — Ambulatory Visit (HOSPITAL_COMMUNITY): Payer: Self-pay | Admitting: Emergency Medicine

## 2024-07-03 DIAGNOSIS — H8110 Benign paroxysmal vertigo, unspecified ear: Secondary | ICD-10-CM | POA: Insufficient documentation

## 2024-07-03 DIAGNOSIS — R42 Dizziness and giddiness: Secondary | ICD-10-CM | POA: Insufficient documentation

## 2024-07-03 HISTORY — DX: Disorder of thyroid, unspecified: E07.9

## 2024-07-03 LAB — COMPREHENSIVE METABOLIC PANEL WITH GFR
ALT: 270 U/L — ABNORMAL HIGH (ref 0–44)
AST: 161 U/L — ABNORMAL HIGH (ref 15–41)
Albumin: 3.8 g/dL (ref 3.5–5.0)
Alkaline Phosphatase: 79 U/L (ref 38–126)
Anion gap: 9 (ref 5–15)
BUN: 12 mg/dL (ref 6–20)
CO2: 24 mmol/L (ref 22–32)
Calcium: 9.2 mg/dL (ref 8.9–10.3)
Chloride: 105 mmol/L (ref 98–111)
Creatinine, Ser: 0.75 mg/dL (ref 0.61–1.24)
GFR, Estimated: >60 mL/min (ref >60)
Glucose, Bld: 97 mg/dL (ref 70–99)
Potassium: 4.2 mmol/L (ref 3.5–5.1)
Sodium: 138 mmol/L (ref 135–145)
Total Bilirubin: 1.2 mg/dL (ref 0.0–1.2)
Total Protein: 7.6 g/dL (ref 6.5–8.1)

## 2024-07-03 LAB — CBC WITH DIFFERENTIAL/PLATELET
Abs Immature Granulocytes: 0.01 K/uL (ref 0.00–0.07)
Basophils Absolute: 0 K/uL (ref 0.0–0.1)
Basophils Relative: 1 %
Eosinophils Absolute: 0.2 K/uL (ref 0.0–0.5)
Eosinophils Relative: 4 %
HCT: 46.8 % (ref 39.0–52.0)
Hemoglobin: 15.8 g/dL (ref 13.0–17.0)
Immature Granulocytes: 0 %
Lymphocytes Relative: 32 %
Lymphs Abs: 1.5 K/uL (ref 0.7–4.0)
MCH: 29.7 pg (ref 26.0–34.0)
MCHC: 33.8 g/dL (ref 30.0–36.0)
MCV: 88 fL (ref 80.0–100.0)
Monocytes Absolute: 0.3 K/uL (ref 0.1–1.0)
Monocytes Relative: 7 %
Neutro Abs: 2.6 K/uL (ref 1.7–7.7)
Neutrophils Relative %: 56 %
Platelets: 288 K/uL (ref 150–400)
RBC: 5.32 MIL/uL (ref 4.22–5.81)
RDW: 12.8 % (ref 11.5–15.5)
WBC: 4.7 K/uL (ref 4.0–10.5)
nRBC: 0 % (ref 0.0–0.2)

## 2024-07-03 LAB — GLUCOSE, POCT (MANUAL RESULT ENTRY): POC Glucose: 113 mg/dL — AB (ref 70–99)

## 2024-07-03 MED ORDER — MECLIZINE HCL 12.5 MG PO TABS: 12.5000 mg | ORAL_TABLET | Freq: Three times a day (TID) | ORAL | 0 refills | Status: AC | PRN

## 2024-07-03 NOTE — ED Triage Notes (Signed)
 Yesterday morning, noticed if he turned his head to the left or right or looked up or down , he became dizzy.  This is happening today too.  No blurry vision, but shiny areas.  Denies pain, but mentions pressure in chest.    While lying down, if he turns on right side, he gets dizzy Has only taken thyroid  medicine

## 2024-07-03 NOTE — Discharge Instructions (Signed)
 Meclizine is a medicine used for dizziness.  You can take 1 tablet 3 times daily as needed. Increase your water intake I will call you later today if there is anything abnormal on your blood work Please call your primary care provider/endocrinologist to make a follow-up appointment. If at any point symptoms worsen or become severe, go to the emergency department  La meclizina es un medicamento que se usa  para el mareo. Puede tomar 1 tableta 3 veces al da segn sea necesario. Aumente su consumo de agua. Le llamar ms tarde si hay alguna anomala en sus anlisis de [place]. Por favor, llame a su mdico de cabecera/endocrinlogo para programar una cita de seguimiento. Si en algn momento los sntomas empeoran o se vuelven graves, acuda al departamento de emergencias

## 2024-07-03 NOTE — ED Provider Notes (Signed)
 [facility]    CSN: [id number] Arrival date & time: 07/03/24  0802      History   Chief Complaint Chief Complaint  Patient presents with   Dizziness    HPI Andrew Duffy is a 38 y.o. male.  Medical interpretor used for encounter  Here with dizziness that started yesterday, only with head movement. Feels like the room spins, and then it slowly fades.  Occurred last night when laying down, and turning onto his right side.  This morning still having symptoms only with head movements.   He initially reported pressure in the chest last night. However on further questioning he only has muscular chest pain with movements, after strenuous activity the last few days. He does not have any pain or pressure at rest, and has no chest symptoms currently.   Denies headache or vision changes No head injury or fall   Hx hypothyroid on levothyroxine  Episode of SVT that converted to afib with adenosine  in 2014   Past Medical History:  Diagnosis Date   Atrial fibrillation [facility])    Following treatment of SVT with adenosine    SVT (supraventricular tachycardia)    Thyroid  disease     Patient Active Problem List   Diagnosis Date Noted   Hypothyroidism, postablative 02/22/2014   Hypokalemia 11/08/2013   A-fib (HCC) 11/08/2013   SVT (supraventricular tachycardia) 11/08/2013   Elevated troponin 11/08/2013    Past Surgical History:  Procedure Laterality Date   None     THYROIDECTOMY         Home Medications    Prior to Admission medications   Medication Sig Start Date End Date Taking? Authorizing Provider  meclizine (ANTIVERT) 12.5 MG tablet Take 1 tablet (12.5 mg total) by mouth 3 (three) times daily as needed for dizziness. Una tableta 3 veces al dia, segun necesario 07/03/24  Yes Bonham Zingale, [place], PA-C  acetaminophen  (TYLENOL ) 500 MG tablet Take 1,000 mg by mouth every 6 (six) hours as needed for headache.    [provider]  levothyroxine  (SYNTHROID )  112 MCG tablet Take 1 tablet (112 mcg total) by mouth daily. 09/25/23   Thapa, [place], MD    Family History Family History  Problem Relation Age of Onset   Thyroid  disease Other    Cancer Other     Social History Social History   Tobacco Use   Smoking status: Never   Smokeless tobacco: Never   Tobacco comments:    Few cigarettes a week.    Vaping Use   Vaping status: Never Used  Substance Use Topics   Alcohol use: Yes    Comment: occ   Drug use: No     Allergies   Patient has no known allergies.   Review of Systems Review of Systems  Physical Exam Triage Vital Signs ED Triage Vitals  Encounter Vitals Group     BP 07/03/24 0823 133/84     Girls Systolic BP Percentile --      Girls Diastolic BP Percentile --      Boys Systolic BP Percentile --      Boys Diastolic BP Percentile --      Pulse Rate 07/03/24 0823 64     Resp 07/03/24 0823 18     Temp 07/03/24 0823 98.2 F (36.8 C)     Temp Source 07/03/24 0823 Oral     SpO2 07/03/24 0823 96 %     Weight --      Height --  Head Circumference --      Peak Flow --      Pain Score 07/03/24 0821 0     Pain Loc --      Pain Education --      Exclude from Growth Chart --    No data found.  Updated Vital Signs BP 133/84 (BP Location: Left Arm)   Pulse 64   Temp 98.2 F (36.8 C) (Oral)   Resp 18   SpO2 96%    Physical Exam Vitals and nursing note reviewed.  Constitutional:      General: He is not in acute distress.    Appearance: He is not ill-appearing or diaphoretic.  HENT:     Head: Atraumatic.     Right Ear: Tympanic membrane and ear canal normal.     Left Ear: Tympanic membrane and ear canal normal.     Nose: Nose normal.     Mouth/Throat:     Mouth: Mucous membranes are moist.     Pharynx: Oropharynx is clear.  Eyes:     General: Lids are normal. Vision grossly intact. Gaze aligned appropriately.     Extraocular Movements: Extraocular movements intact.     Right eye: Normal extraocular  motion and no nystagmus.     Left eye: Normal extraocular motion and no nystagmus.     Pupils: Pupils are equal, round, and reactive to light.     Comments: Pterygium right, extending to pupil  Cardiovascular:     Rate and Rhythm: Normal rate and regular rhythm.     Pulses: Normal pulses.          Radial pulses are 2+ on the right side and 2+ on the left side.     Heart sounds: Normal heart sounds.  Pulmonary:     Effort: Pulmonary effort is normal.     Breath sounds: Normal breath sounds.  Abdominal:     General: There is no distension.     Palpations: Abdomen is soft.     Tenderness: There is no abdominal tenderness.  Musculoskeletal:        General: Normal range of motion.     Cervical back: Normal range of motion. No rigidity.  Lymphadenopathy:     Cervical: No cervical adenopathy.  Skin:    General: Skin is warm and dry.  Neurological:     General: No focal deficit present.     Mental Status: He is alert and oriented to person, place, and time.     Cranial Nerves: Cranial nerves 2-12 are intact. No cranial nerve deficit or facial asymmetry.     Sensory: No sensory deficit.     Motor: No weakness or pronator drift.     Coordination: Romberg sign negative. Coordination normal.     Gait: Gait normal.     Comments: Strength and sensation equal, intact. CN 2-12 intact. Negative pronator drift and romberg.       UC Treatments / Results  Labs (all labs ordered are listed, but only abnormal results are displayed) Labs Reviewed  GLUCOSE, POCT (MANUAL RESULT ENTRY) - Abnormal; Notable for the following components:      Result Value   POC Glucose 113 (*)    All other components within normal limits  CBC WITH DIFFERENTIAL/PLATELET  COMPREHENSIVE METABOLIC PANEL WITH GFR    EKG  Radiology No results found.  Procedures Procedures (including critical care time)  Medications Ordered in UC Medications - No data to display  Initial Impression / Assessment and  Plan / UC  Course  I have reviewed the triage vital signs and the nursing notes.  Pertinent labs & imaging results that were available during my care of the patient were reviewed by me and considered in my medical decision making (see chart for details).  Afebrile, stable vitals, well-appearing Neurologically intact CBG 113 EKG sinus bradycardia with ventricular rate of 58 bpm.  No obvious ST or T wave changes, compared with prior reading from March 2015. Symptoms consistent with positional vertigo. Trial of meclizine.  CBC and CMP are pending.  I have asked him to follow up with his PCP or endocrinologist if symptoms are persisting. Understands reasons to be seen in the emergency department. No questions at this time, agrees to plan  Final Clinical Impressions(s) / UC Diagnoses   Final diagnoses:  Dizziness  Benign paroxysmal positional vertigo, unspecified laterality     Discharge Instructions      Meclizine is a medicine used for dizziness.  You can take 1 tablet 3 times daily as needed. Increase your water intake I will call you later today if there is anything abnormal on your blood work Please call your primary care provider/endocrinologist to make a follow-up appointment. If at any point symptoms worsen or become severe, go to the emergency department  La meclizina es un medicamento que se usa  para el mareo. Puede tomar 1 tableta 3 veces al da segn sea necesario. Aumente su consumo de agua. Le llamar ms tarde si hay alguna anomala en sus anlisis de [place]. Por favor, llame a su mdico de cabecera/endocrinlogo para programar una cita de seguimiento. Si en algn momento los sntomas empeoran o se vuelven graves, acuda al departamento de emergencias     ED Prescriptions     Medication Sig Dispense Auth. Provider   meclizine (ANTIVERT) 12.5 MG tablet Take 1 tablet (12.5 mg total) by mouth 3 (three) times daily as needed for dizziness. Una tableta 3 veces al dia, segun  necesario 30 tablet Junie Engram, [place], PA-C      PDMP not reviewed this encounter.   Jeryl [place]berry, [place] 07/03/24 [id number]

## 2024-09-29 ENCOUNTER — Ambulatory Visit: Payer: Self-pay | Admitting: Endocrinology
